# Patient Record
Sex: Female | Born: 1985
Health system: Southern US, Community
[De-identification: ages and names within clinical notes are randomized; demographics above are authoritative.]

## PROBLEM LIST (undated history)

## (undated) DIAGNOSIS — K429 Umbilical hernia without obstruction or gangrene: Secondary | ICD-10-CM

## (undated) DIAGNOSIS — G4730 Sleep apnea, unspecified: Secondary | ICD-10-CM

## (undated) DIAGNOSIS — A64 Unspecified sexually transmitted disease: Secondary | ICD-10-CM

## (undated) DIAGNOSIS — E669 Obesity, unspecified: Secondary | ICD-10-CM

## (undated) DIAGNOSIS — N809 Endometriosis, unspecified: Secondary | ICD-10-CM

## (undated) DIAGNOSIS — N83202 Unspecified ovarian cyst, left side: Secondary | ICD-10-CM

## (undated) DIAGNOSIS — D649 Anemia, unspecified: Secondary | ICD-10-CM

## (undated) DIAGNOSIS — N83201 Unspecified ovarian cyst, right side: Secondary | ICD-10-CM

## (undated) HISTORY — PX: ADENOIDECTOMY: SHX5191

## (undated) HISTORY — PX: CYSTECTOMY: SUR359

## (undated) HISTORY — DX: Unspecified sexually transmitted disease: A64

## (undated) HISTORY — PX: TONSILLECTOMY: SUR1361

## (undated) HISTORY — PX: OTHER SURGICAL HISTORY: SHX169

## (undated) HISTORY — PX: TOOTH EXTRACTION: SUR596

---

## 2004-02-02 ENCOUNTER — Inpatient Hospital Stay (HOSPITAL_COMMUNITY): Admission: AD | Admit: 2004-02-02 | Discharge: 2004-02-02 | Payer: Self-pay | Admitting: Obstetrics and Gynecology

## 2004-02-02 ENCOUNTER — Emergency Department (HOSPITAL_COMMUNITY): Admission: EM | Admit: 2004-02-02 | Discharge: 2004-02-02 | Payer: Self-pay | Admitting: Emergency Medicine

## 2004-02-05 ENCOUNTER — Inpatient Hospital Stay (HOSPITAL_COMMUNITY): Admission: AD | Admit: 2004-02-05 | Discharge: 2004-02-05 | Payer: Self-pay | Admitting: *Deleted

## 2004-02-07 ENCOUNTER — Inpatient Hospital Stay (HOSPITAL_COMMUNITY): Admission: AD | Admit: 2004-02-07 | Discharge: 2004-02-09 | Payer: Self-pay | Admitting: Obstetrics & Gynecology

## 2005-08-01 ENCOUNTER — Inpatient Hospital Stay (HOSPITAL_COMMUNITY): Admission: AD | Admit: 2005-08-01 | Discharge: 2005-08-01 | Payer: Self-pay | Admitting: *Deleted

## 2005-10-26 HISTORY — PX: DILATION AND CURETTAGE OF UTERUS: SHX78

## 2005-12-21 HISTORY — PX: OTHER SURGICAL HISTORY: SHX169

## 2010-01-13 ENCOUNTER — Emergency Department (HOSPITAL_BASED_OUTPATIENT_CLINIC_OR_DEPARTMENT_OTHER): Admission: EM | Admit: 2010-01-13 | Discharge: 2010-01-13 | Payer: Self-pay | Admitting: Emergency Medicine

## 2010-01-13 ENCOUNTER — Ambulatory Visit: Payer: Self-pay | Admitting: Interventional Radiology

## 2010-05-21 ENCOUNTER — Emergency Department (HOSPITAL_BASED_OUTPATIENT_CLINIC_OR_DEPARTMENT_OTHER): Admission: EM | Admit: 2010-05-21 | Discharge: 2010-05-21 | Payer: Self-pay | Admitting: Emergency Medicine

## 2010-05-21 ENCOUNTER — Ambulatory Visit: Payer: Self-pay | Admitting: Diagnostic Radiology

## 2010-06-24 ENCOUNTER — Ambulatory Visit (HOSPITAL_COMMUNITY): Admission: RE | Admit: 2010-06-24 | Discharge: 2010-06-24 | Payer: Self-pay | Admitting: Surgery

## 2010-10-18 ENCOUNTER — Emergency Department (HOSPITAL_BASED_OUTPATIENT_CLINIC_OR_DEPARTMENT_OTHER)
Admission: EM | Admit: 2010-10-18 | Discharge: 2010-10-18 | Payer: Self-pay | Source: Home / Self Care | Admitting: Emergency Medicine

## 2010-12-15 ENCOUNTER — Encounter (HOSPITAL_COMMUNITY): Payer: Self-pay | Admitting: Radiology

## 2010-12-15 ENCOUNTER — Inpatient Hospital Stay (HOSPITAL_COMMUNITY): Payer: Self-pay

## 2010-12-15 ENCOUNTER — Inpatient Hospital Stay (HOSPITAL_COMMUNITY)
Admission: AD | Admit: 2010-12-15 | Discharge: 2010-12-15 | Disposition: A | Discharge status: Home / Self Care | Payer: Self-pay | Source: Ambulatory Visit | Attending: Obstetrics & Gynecology | Admitting: Obstetrics & Gynecology

## 2010-12-15 DIAGNOSIS — R109 Unspecified abdominal pain: Secondary | ICD-10-CM | POA: Insufficient documentation

## 2010-12-15 DIAGNOSIS — R52 Pain, unspecified: Secondary | ICD-10-CM

## 2010-12-15 DIAGNOSIS — O3680X0 Pregnancy with inconclusive fetal viability, not applicable or unspecified: Secondary | ICD-10-CM

## 2010-12-15 DIAGNOSIS — O98819 Other maternal infectious and parasitic diseases complicating pregnancy, unspecified trimester: Secondary | ICD-10-CM | POA: Insufficient documentation

## 2010-12-15 DIAGNOSIS — A5901 Trichomonal vulvovaginitis: Secondary | ICD-10-CM | POA: Insufficient documentation

## 2010-12-15 LAB — URINALYSIS, ROUTINE W REFLEX MICROSCOPIC
Nitrite: NEGATIVE
Specific Gravity, Urine: 1.025 (ref 1.005–1.030)

## 2010-12-15 LAB — HCG, QUANTITATIVE, PREGNANCY: hCG, Beta Chain, Quant, S: 1460 m[IU]/mL — ABNORMAL HIGH (ref <5)

## 2010-12-15 LAB — URINE MICROSCOPIC-ADD ON

## 2010-12-15 LAB — CBC
HCT: 34.4 % — ABNORMAL LOW (ref 36.0–46.0)
MCH: 25.8 pg — ABNORMAL LOW (ref 26.0–34.0)
MCV: 73.3 fL — ABNORMAL LOW (ref 78.0–100.0)
Platelets: 278 10*3/uL (ref 150–400)

## 2010-12-15 LAB — WET PREP, GENITAL

## 2010-12-15 LAB — ABO/RH: ABO/RH(D): O POS

## 2010-12-16 LAB — URINE CULTURE

## 2010-12-16 LAB — GC/CHLAMYDIA PROBE AMP, GENITAL
Chlamydia, DNA Probe: NEGATIVE
GC Probe Amp, Genital: NEGATIVE

## 2010-12-19 ENCOUNTER — Other Ambulatory Visit: Payer: Self-pay | Admitting: Obstetrics & Gynecology

## 2010-12-19 ENCOUNTER — Other Ambulatory Visit (HOSPITAL_COMMUNITY): Payer: Self-pay | Admitting: Maternal and Fetal Medicine

## 2010-12-19 ENCOUNTER — Inpatient Hospital Stay (HOSPITAL_COMMUNITY)
Admission: AD | Admit: 2010-12-19 | Discharge: 2010-12-19 | Disposition: A | Discharge status: Home / Self Care | Payer: Self-pay | Source: Ambulatory Visit | Attending: Obstetrics & Gynecology | Admitting: Obstetrics & Gynecology

## 2010-12-19 DIAGNOSIS — O99891 Other specified diseases and conditions complicating pregnancy: Secondary | ICD-10-CM | POA: Insufficient documentation

## 2010-12-19 DIAGNOSIS — O3680X0 Pregnancy with inconclusive fetal viability, not applicable or unspecified: Secondary | ICD-10-CM

## 2010-12-19 DIAGNOSIS — R109 Unspecified abdominal pain: Secondary | ICD-10-CM | POA: Insufficient documentation

## 2010-12-25 ENCOUNTER — Other Ambulatory Visit (HOSPITAL_COMMUNITY): Payer: Self-pay

## 2010-12-25 ENCOUNTER — Inpatient Hospital Stay (HOSPITAL_COMMUNITY)
Admission: AD | Admit: 2010-12-25 | Discharge: 2010-12-25 | Disposition: A | Discharge status: Home / Self Care | Payer: Self-pay | Source: Ambulatory Visit | Attending: Family Medicine | Admitting: Family Medicine

## 2010-12-25 ENCOUNTER — Ambulatory Visit (HOSPITAL_COMMUNITY)
Admission: RE | Admit: 2010-12-25 | Discharge: 2010-12-25 | Disposition: A | Discharge status: Home / Self Care | Payer: Medicaid Other | Source: Ambulatory Visit | Attending: Obstetrics & Gynecology | Admitting: Obstetrics & Gynecology

## 2010-12-25 DIAGNOSIS — R109 Unspecified abdominal pain: Secondary | ICD-10-CM

## 2010-12-25 DIAGNOSIS — Z3689 Encounter for other specified antenatal screening: Secondary | ICD-10-CM | POA: Insufficient documentation

## 2010-12-25 DIAGNOSIS — O99891 Other specified diseases and conditions complicating pregnancy: Secondary | ICD-10-CM | POA: Insufficient documentation

## 2010-12-25 DIAGNOSIS — O3680X0 Pregnancy with inconclusive fetal viability, not applicable or unspecified: Secondary | ICD-10-CM

## 2010-12-25 DIAGNOSIS — O9989 Other specified diseases and conditions complicating pregnancy, childbirth and the puerperium: Secondary | ICD-10-CM

## 2011-01-09 LAB — COMPREHENSIVE METABOLIC PANEL
AST: 17 U/L (ref 0–37)
Albumin: 3.6 g/dL (ref 3.5–5.2)
Alkaline Phosphatase: 102 U/L (ref 39–117)
Creatinine, Ser: 0.67 mg/dL (ref 0.4–1.2)
GFR calc Af Amer: >60 mL/min (ref >60)
Sodium: 138 mEq/L (ref 135–145)
Total Protein: 6.9 g/dL (ref 6.0–8.3)

## 2011-01-09 LAB — DIFFERENTIAL
Basophils Relative: 0 % (ref 0–1)
Eosinophils Absolute: 0.1 10*3/uL (ref 0.0–0.7)
Eosinophils Relative: 2 % (ref 0–5)
Lymphocytes Relative: 42 % (ref 12–46)
Monocytes Relative: 9 % (ref 3–12)
Neutrophils Relative %: 46 % (ref 43–77)

## 2011-01-09 LAB — CBC
HCT: 33.7 % — ABNORMAL LOW (ref 36.0–46.0)
MCH: 26.1 pg (ref 26.0–34.0)
MCHC: 35.9 g/dL (ref 30.0–36.0)
RBC: 4.64 MIL/uL (ref 3.87–5.11)
RDW: 16.8 % — ABNORMAL HIGH (ref 11.5–15.5)

## 2011-02-17 ENCOUNTER — Inpatient Hospital Stay (HOSPITAL_COMMUNITY)
Admission: AD | Admit: 2011-02-17 | Discharge: 2011-02-17 | Disposition: A | Discharge status: Home / Self Care | Payer: Medicaid Other | Source: Ambulatory Visit | Attending: Obstetrics & Gynecology | Admitting: Obstetrics & Gynecology

## 2011-02-17 DIAGNOSIS — O9989 Other specified diseases and conditions complicating pregnancy, childbirth and the puerperium: Secondary | ICD-10-CM

## 2011-02-17 DIAGNOSIS — K429 Umbilical hernia without obstruction or gangrene: Secondary | ICD-10-CM | POA: Insufficient documentation

## 2011-02-17 DIAGNOSIS — O99891 Other specified diseases and conditions complicating pregnancy: Secondary | ICD-10-CM | POA: Insufficient documentation

## 2011-02-17 LAB — HIV ANTIBODY (ROUTINE TESTING W REFLEX): HIV: NONREACTIVE

## 2011-02-17 LAB — ANTIBODY SCREEN: Antibody Screen: POSITIVE

## 2011-02-17 LAB — ABO/RH: RH Type: POSITIVE

## 2011-02-17 LAB — RUBELLA ANTIBODY, IGM: Rubella: IMMUNE

## 2011-02-18 ENCOUNTER — Other Ambulatory Visit: Payer: Self-pay | Admitting: Surgery

## 2011-02-18 ENCOUNTER — Ambulatory Visit (HOSPITAL_COMMUNITY)
Admission: RE | Admit: 2011-02-18 | Discharge: 2011-02-18 | Disposition: A | Discharge status: Home / Self Care | Payer: Medicaid Other | Source: Ambulatory Visit | Attending: Surgery | Admitting: Surgery

## 2011-02-18 DIAGNOSIS — O99891 Other specified diseases and conditions complicating pregnancy: Secondary | ICD-10-CM | POA: Insufficient documentation

## 2011-02-18 DIAGNOSIS — L980 Pyogenic granuloma: Secondary | ICD-10-CM | POA: Insufficient documentation

## 2011-02-18 DIAGNOSIS — K429 Umbilical hernia without obstruction or gangrene: Secondary | ICD-10-CM | POA: Insufficient documentation

## 2011-02-18 DIAGNOSIS — Z01812 Encounter for preprocedural laboratory examination: Secondary | ICD-10-CM | POA: Insufficient documentation

## 2011-02-18 HISTORY — PX: EXCISION OF ENDOMETRIOMA: SHX6473

## 2011-02-18 LAB — COMPREHENSIVE METABOLIC PANEL
AST: 15 U/L (ref 0–37)
Alkaline Phosphatase: 72 U/L (ref 39–117)
Calcium: 8.9 mg/dL (ref 8.4–10.5)
Creatinine, Ser: 0.6 mg/dL (ref 0.4–1.2)
GFR calc non Af Amer: >60 mL/min (ref >60)
Potassium: 4 mEq/L (ref 3.5–5.1)
Sodium: 136 mEq/L (ref 135–145)
Total Bilirubin: 0.5 mg/dL (ref 0.3–1.2)
Total Protein: 7.5 g/dL (ref 6.0–8.3)

## 2011-02-18 LAB — DIFFERENTIAL
Basophils Absolute: 0 10*3/uL (ref 0.0–0.1)
Basophils Relative: 0 % (ref 0–1)
Eosinophils Absolute: 0 10*3/uL (ref 0.0–0.7)
Eosinophils Relative: 0 % (ref 0–5)
Lymphocytes Relative: 40 % (ref 12–46)
Lymphs Abs: 1.9 10*3/uL (ref 0.7–4.0)
Monocytes Absolute: 0.4 10*3/uL (ref 0.1–1.0)
Monocytes Relative: 8 % (ref 3–12)
Neutro Abs: 2.5 10*3/uL (ref 1.7–7.7)
Neutrophils Relative %: 52 % (ref 43–77)

## 2011-02-18 LAB — SURGICAL PCR SCREEN
MRSA, PCR: NEGATIVE
Staphylococcus aureus: NEGATIVE

## 2011-02-18 LAB — CBC
HCT: 33.3 % — ABNORMAL LOW (ref 36.0–46.0)
Hemoglobin: 12 g/dL (ref 12.0–15.0)
MCH: 27.2 pg (ref 26.0–34.0)
MCHC: 36 g/dL (ref 30.0–36.0)
MCV: 75.5 fL — ABNORMAL LOW (ref 78.0–100.0)
RBC: 4.41 MIL/uL (ref 3.87–5.11)
WBC: 4.7 10*3/uL (ref 4.0–10.5)

## 2011-02-23 NOTE — H&P (Signed)
Kathleen Watkins, Kathleen Watkins             ACCOUNT NO.:  [id number]  MEDICAL RECORD NO.:  [id number]           PATIENT TYPE:  O  LOCATION:  [facility]                         FACILITY:  [facility]  PHYSICIAN:  Kyree Fedorko A. Ulyess Muto, M.D.DATE OF BIRTH:  Oct 22, 1986  DATE OF ADMISSION:  02/17/2011 DATE OF DISCHARGE:  02/17/2011                             HISTORY & PHYSICAL   CHIEF COMPLAINT:  Drainage from umbilical hernia.  HISTORY OF PRESENT ILLNESS:  The patient is 25 year old female who was seen back in August 2011 by Dr. Claud Kelp due to a periumbilical hernia after being seen in the emergency room secondary to periumbilical drainage.  He examined her, he saw no evidence of open wound or drainage from the umbilicus at that time.  She has a 4.3 x 4.3 cm periumbilical hernia and was scheduled for surgery but she held off due to other issues.  She is now 14 weeks' pregnant.  She [facility] and open red areas in the umbilicus.  No nausea, no vomiting.  No fever, no chills.  She has had brown-to-tan drainage from the area.  PAST SURGICAL HISTORY:  C-section and ganglion cyst.  CURRENT MEDICATIONS:  Prenatal vitamins.  ALLERGIES TO MEDICINES:  None.  FAMILY HISTORY:  None.  REVIEW OF SYSTEMS:  As above, otherwise, negative.  PHYSICAL EXAMINATION:  VITAL SIGNS:  Temperature 97, pulse 80, blood pressure 130/78. GENERAL APPEARANCE:  Pleasant female, in no apparent distress. HEENT:  No jaundice.  Oropharynx, moist mucous membranes. NECK:  Supple, nontender.  Trachea midline without mass. CARDIOVASCULAR:  Normal sinus rhythm without rub, murmur, or gallop. ABDOMEN:  Morbidly obese.  There are 2 small open areas in the umbilicus that are red and draining some tan pink fluid.  Difficult to feel hernia.  Morbidly obese, large pannus.  This tracks downward it looks like.  No redness.  Succus still draining from the open wound to  the umbilicus. EXTREMITIES:  normal. NEUROLOGIC: normal_.  IMPRESSION:  History of 4.3 x 4.3 cm periumbilical hernia, now with drainage from the umbilicus which is worse than the past.  She is now 14 weeks' pregnant.  I feel that this needs to be explored in the operating room given her past history.  I spoke with Dr. Abigail Miyamoto who is on-call Chad and has agreed to see her tomorrow and try to repair this periumbilical hernia and explore umbilicus and probably debride this that is where it is going to be giving smell.  I do not see signs of any invasive infection or redness but with this open wound being present as well as hernia, there is concern this could be erosion of the skin over the hernia sac and this could be an issue.  I have discussed this with the patient as well as risks of bleeding and infection and the potential fetal demise and loss in this setting but she is in 2nd trimester and therefore I think this is the best time to proceed and I am not sure waiting until her delivery would be wise in this setting, this could certainly become more of an  issue for her.  She understands the above.     Kathleen Watkins, M.D.     TAC/MEDQ  D:  02/17/2011  T:  02/18/2011  Job:  [id number]  Electronically Signed by Harriette Bouillon M.D. on 02/23/2011 07:33:08 AM

## 2011-02-25 NOTE — Op Note (Signed)
  [name], Kathleen Watkins             ACCOUNT NO.:  [id number]  MEDICAL RECORD NO.:  [id number]           PATIENT TYPE:  O  LOCATION:  [facility]                         FACILITY:  [facility]  PHYSICIAN:  Abigail Miyamoto, M.D. DATE OF BIRTH:  04/14/1986  DATE OF PROCEDURE: DATE OF DISCHARGE:                              OPERATIVE REPORT   PREOPERATIVE DIAGNOSIS:  Draining umbilical hernia.  POSTOPERATIVE DIAGNOSIS:  Draining umbilical hernia.  PROCEDURE:  Umbilical hernia repair with excision of chronic granulation tissue.  SURGEON:  Abigail Miyamoto, M.D.  ANESTHESIA:  General with 0.5% Marcaine.  ESTIMATED BLOOD LOSS:  Minimal.  INDICATION FOR PROCEDURE:  Kathleen Watkins is a 25 year old female who is 14 weeks' pregnant.  She presents with an umbilical hernia.  She has large amount of granulation tissue on her umbilicus which is draining fluid.  Decision was made to proceed with repair of this.  PROCEDURE IN DETAIL:  The patient was brought to the operating room and identified as Kathleen Watkins.  She is placed supine on the operating table and general anesthesia was induced.  Her abdomen was then prepped and draped in usual sterile fashion.  I was able to grasp the chronic draining skin with an Allis clamp and was able to involute her umbilicus, raising all the umbilical skin up.  I then performed elliptical incision with a #15 blade, removed all the chronic granulation tissue.  There was a lot of cystic appearing structures and fluids underneath the umbilical skin which I removed en block with a draining tissue.  I saw no gross purulence.  This was sitting right on the hernia sac, which was excised as well.  I sent the chronic tissue to pathology for evaluation.  Omentum was found to be in the hernia which was easily reduced back to abdominal cavity.  I then closed the patient's fascial defect with several figure-of-eight #1 Novafil sutures.  I elected not to use mesh because of  the chronic draining tissue and for fear of infection.  The fascial defect was easily closed with interrupted nylon sutures.  I then anesthetized the fascia and skin with 0.5% Marcaine.  I then tacked the umbilical skin back in place with 3-0 Vicryl suture.  I then closed the skin with interrupted 3-0 nylon sutures.  A piece of Xeroform was placed on the incision, gauze tape was then placed there as well.  The patient tolerated the procedure well.  All counts were correct at the end of the procedure. The patient was then extubated in the operating room and taken in stable condition to recovery room.     Abigail Miyamoto, M.D.     DB/MEDQ  D:  02/18/2011  T:  02/18/2011  Job:  [id number]  Electronically Signed by Abigail Miyamoto M.D. on 02/25/2011 01:55:53 PM

## 2011-03-04 ENCOUNTER — Other Ambulatory Visit: Payer: Self-pay | Admitting: Obstetrics and Gynecology

## 2011-03-04 ENCOUNTER — Other Ambulatory Visit: Payer: Self-pay | Admitting: Obstetrics & Gynecology

## 2011-03-04 DIAGNOSIS — O99210 Obesity complicating pregnancy, unspecified trimester: Secondary | ICD-10-CM

## 2011-03-04 DIAGNOSIS — Z3689 Encounter for other specified antenatal screening: Secondary | ICD-10-CM

## 2011-03-04 DIAGNOSIS — Z331 Pregnant state, incidental: Secondary | ICD-10-CM

## 2011-03-04 DIAGNOSIS — Z0489 Encounter for examination and observation for other specified reasons: Secondary | ICD-10-CM

## 2011-03-04 DIAGNOSIS — E669 Obesity, unspecified: Secondary | ICD-10-CM

## 2011-03-04 DIAGNOSIS — O2690 Pregnancy related conditions, unspecified, unspecified trimester: Secondary | ICD-10-CM

## 2011-03-04 LAB — POCT URINALYSIS DIP (DEVICE)
Ketones, ur: NEGATIVE mg/dL
Protein, ur: 30 mg/dL — AB
Specific Gravity, Urine: 1.025 (ref 1.005–1.030)

## 2011-03-13 NOTE — Discharge Summary (Signed)
NAME:  Kathleen Watkins, Kathleen Watkins                       ACCOUNT NO.:  [id number]   MEDICAL RECORD NO.:  [id number]                   PATIENT TYPE:  INP   LOCATION:  9306                                 FACILITY:  [facility]   PHYSICIAN:  Phil D. Okey Dupre, M.D.                  DATE OF BIRTH:  [birth year]   DATE OF ADMISSION:  02/07/2004  DATE OF DISCHARGE:                                 DISCHARGE SUMMARY   LABORATORY DATA:  1. Hepatitis panel pending. Urine pregnancy test negative.  2. White blood cell count 5.3, hemoglobin 13.1, hematocrit 39.5, platelet     count 327, neutrophils 38.  3. HIV negative.  4. HSV culture pending.  5. RPR negative.   STUDIES:  None.   HOSPITAL COURSE:  This is a 25 year old G0 P0 who presented on February 07, 2004 to the [facility] with vaginal itching and pain which was 10/10 and so bad  that she was unable to urinate at all.  She was diagnosed with a primary  herpes infection and admitted upstairs for pain control and Foley catheter  placed.  She was given narcotics for pain and started on acyclovir IV for  the primary infection.  Over the next couple of days her pain lessened and  she was able to urinate on her own on day of discharge.  She was also eating  and drinking well and wanted to go home.   MEDICATIONS AT DISCHARGE:  1. Valtrex 500 mg p.o. b.i.d. x5 days.  2. Percocet 5/325 p.o. q.4h. p.r.n. pain.   FOLLOW-UP VISITS:  The patient is to follow up with Dr. __________ in a  month for follow-up and also she is to follow up with him in six months for  an HIV test.     Miles Costain, M.D.                          Phil D. Okey Dupre, M.D.    DY/MEDQ  D:  02/09/2004  T:  02/09/2004  Job:  [id number]

## 2011-03-18 ENCOUNTER — Ambulatory Visit (HOSPITAL_COMMUNITY): Payer: Medicaid Other

## 2011-03-18 ENCOUNTER — Other Ambulatory Visit: Payer: Self-pay | Admitting: Obstetrics and Gynecology

## 2011-03-18 DIAGNOSIS — E669 Obesity, unspecified: Secondary | ICD-10-CM

## 2011-03-18 DIAGNOSIS — O99210 Obesity complicating pregnancy, unspecified trimester: Secondary | ICD-10-CM

## 2011-03-18 DIAGNOSIS — Z331 Pregnant state, incidental: Secondary | ICD-10-CM

## 2011-03-18 DIAGNOSIS — O99810 Abnormal glucose complicating pregnancy: Secondary | ICD-10-CM

## 2011-03-18 LAB — POCT URINALYSIS DIP (DEVICE)
Glucose, UA: NEGATIVE mg/dL
Hgb urine dipstick: NEGATIVE
Specific Gravity, Urine: 1.02 (ref 1.005–1.030)
Urobilinogen, UA: 1 mg/dL (ref 0.0–1.0)
pH: 6 (ref 5.0–8.0)

## 2011-03-19 ENCOUNTER — Other Ambulatory Visit: Payer: Self-pay | Admitting: Obstetrics and Gynecology

## 2011-03-19 ENCOUNTER — Ambulatory Visit (HOSPITAL_COMMUNITY)
Admission: RE | Admit: 2011-03-19 | Discharge: 2011-03-19 | Disposition: A | Discharge status: Home / Self Care | Payer: Medicaid Other | Source: Ambulatory Visit | Attending: Obstetrics and Gynecology | Admitting: Obstetrics and Gynecology

## 2011-03-19 DIAGNOSIS — O2690 Pregnancy related conditions, unspecified, unspecified trimester: Secondary | ICD-10-CM

## 2011-03-19 DIAGNOSIS — Z0489 Encounter for examination and observation for other specified reasons: Secondary | ICD-10-CM

## 2011-03-19 DIAGNOSIS — Z3689 Encounter for other specified antenatal screening: Secondary | ICD-10-CM | POA: Insufficient documentation

## 2011-03-19 DIAGNOSIS — IMO0002 Reserved for concepts with insufficient information to code with codable children: Secondary | ICD-10-CM

## 2011-03-19 DIAGNOSIS — O09299 Supervision of pregnancy with other poor reproductive or obstetric history, unspecified trimester: Secondary | ICD-10-CM

## 2011-03-24 ENCOUNTER — Encounter: Payer: Medicaid Other | Admitting: Internal Medicine

## 2011-03-24 ENCOUNTER — Other Ambulatory Visit: Payer: Self-pay | Admitting: Internal Medicine

## 2011-03-24 ENCOUNTER — Encounter (HOSPITAL_BASED_OUTPATIENT_CLINIC_OR_DEPARTMENT_OTHER): Payer: Medicaid Other | Admitting: Internal Medicine

## 2011-03-24 DIAGNOSIS — D582 Other hemoglobinopathies: Secondary | ICD-10-CM

## 2011-03-24 LAB — CBC & DIFF AND RETIC
Basophils Absolute: 0 10*3/uL (ref 0.0–0.1)
Eosinophils Absolute: 0 10*3/uL (ref 0.0–0.5)
HGB: 11.5 g/dL — ABNORMAL LOW (ref 11.6–15.9)
LYMPH%: 30.4 % (ref 14.0–49.7)
MCV: 76 fL — ABNORMAL LOW (ref 79.5–101.0)
MONO%: 6.5 % (ref 0.0–14.0)
NEUT#: 3.4 10*3/uL (ref 1.5–6.5)
NEUT%: 62.5 % (ref 38.4–76.8)
Platelets: 235 10*3/uL (ref 145–400)

## 2011-03-31 LAB — HEMOGLOBINOPATHY EVALUATION
Hemoglobin Other: 34 % — ABNORMAL HIGH (ref 0.0–0.0)
Hgb A: 62.6 % — ABNORMAL LOW (ref 96.8–97.8)

## 2011-03-31 LAB — IRON AND TIBC
%SAT: 15 % — ABNORMAL LOW (ref 20–55)
Iron: 49 ug/dL (ref 42–145)

## 2011-03-31 LAB — COMPREHENSIVE METABOLIC PANEL
Alkaline Phosphatase: 92 U/L (ref 39–117)
Glucose, Bld: 97 mg/dL (ref 70–99)
Sodium: 132 mEq/L — ABNORMAL LOW (ref 135–145)
Total Bilirubin: 0.3 mg/dL (ref 0.3–1.2)
Total Protein: 7 g/dL (ref 6.0–8.3)

## 2011-03-31 LAB — HGB ELECTROPHORESIS REFLEXED REPORT
Hemoglobin A - HGBRFX: 58.9 % — ABNORMAL LOW (ref >96.0)
Hemoglobin A2 - HGBRFX: 3 % (ref 1.8–3.5)
Hemoglobin Elect C: 38.1 % — ABNORMAL HIGH

## 2011-03-31 LAB — FERRITIN: Ferritin: 28 ng/mL (ref 10–291)

## 2011-04-01 ENCOUNTER — Inpatient Hospital Stay (HOSPITAL_COMMUNITY)
Admission: AD | Admit: 2011-04-01 | Discharge: 2011-04-01 | Disposition: A | Discharge status: Home / Self Care | Payer: Medicaid Other | Source: Ambulatory Visit | Attending: Obstetrics and Gynecology | Admitting: Obstetrics and Gynecology

## 2011-04-01 ENCOUNTER — Inpatient Hospital Stay (HOSPITAL_COMMUNITY): Payer: Medicaid Other

## 2011-04-01 DIAGNOSIS — O200 Threatened abortion: Secondary | ICD-10-CM

## 2011-04-01 LAB — URINALYSIS, ROUTINE W REFLEX MICROSCOPIC
Bilirubin Urine: NEGATIVE
Glucose, UA: NEGATIVE mg/dL
Hgb urine dipstick: NEGATIVE
Ketones, ur: NEGATIVE mg/dL
pH: 8 (ref 5.0–8.0)

## 2011-04-01 LAB — URINE MICROSCOPIC-ADD ON

## 2011-04-01 LAB — WET PREP, GENITAL: Trich, Wet Prep: NONE SEEN

## 2011-04-02 ENCOUNTER — Other Ambulatory Visit: Payer: Self-pay | Admitting: Obstetrics and Gynecology

## 2011-04-02 ENCOUNTER — Ambulatory Visit (HOSPITAL_COMMUNITY)
Admission: RE | Admit: 2011-04-02 | Discharge: 2011-04-02 | Disposition: A | Discharge status: Home / Self Care | Payer: Medicaid Other | Source: Ambulatory Visit | Attending: Obstetrics and Gynecology | Admitting: Obstetrics and Gynecology

## 2011-04-02 ENCOUNTER — Ambulatory Visit (HOSPITAL_COMMUNITY): Payer: Medicaid Other

## 2011-04-02 DIAGNOSIS — Z0489 Encounter for examination and observation for other specified reasons: Secondary | ICD-10-CM

## 2011-04-02 DIAGNOSIS — O34219 Maternal care for unspecified type scar from previous cesarean delivery: Secondary | ICD-10-CM | POA: Insufficient documentation

## 2011-04-02 DIAGNOSIS — E669 Obesity, unspecified: Secondary | ICD-10-CM | POA: Insufficient documentation

## 2011-04-02 DIAGNOSIS — O09299 Supervision of pregnancy with other poor reproductive or obstetric history, unspecified trimester: Secondary | ICD-10-CM

## 2011-04-02 DIAGNOSIS — O2690 Pregnancy related conditions, unspecified, unspecified trimester: Secondary | ICD-10-CM

## 2011-04-02 LAB — GC/CHLAMYDIA PROBE AMP, GENITAL: Chlamydia, DNA Probe: NEGATIVE

## 2011-04-15 ENCOUNTER — Other Ambulatory Visit: Payer: Self-pay | Admitting: Obstetrics and Gynecology

## 2011-04-15 DIAGNOSIS — O99210 Obesity complicating pregnancy, unspecified trimester: Secondary | ICD-10-CM

## 2011-04-15 DIAGNOSIS — E669 Obesity, unspecified: Secondary | ICD-10-CM

## 2011-04-15 DIAGNOSIS — O99810 Abnormal glucose complicating pregnancy: Secondary | ICD-10-CM

## 2011-04-15 LAB — POCT URINALYSIS DIP (DEVICE)
Bilirubin Urine: NEGATIVE
Glucose, UA: NEGATIVE mg/dL
Ketones, ur: NEGATIVE mg/dL
Specific Gravity, Urine: 1.025 (ref 1.005–1.030)
Urobilinogen, UA: 2 mg/dL — ABNORMAL HIGH (ref 0.0–1.0)

## 2011-04-16 ENCOUNTER — Ambulatory Visit (HOSPITAL_COMMUNITY): Payer: Medicaid Other

## 2011-04-16 ENCOUNTER — Ambulatory Visit (HOSPITAL_COMMUNITY)
Admission: RE | Admit: 2011-04-16 | Discharge: 2011-04-16 | Disposition: A | Discharge status: Home / Self Care | Payer: Medicaid Other | Source: Ambulatory Visit | Attending: Obstetrics and Gynecology | Admitting: Obstetrics and Gynecology

## 2011-04-16 ENCOUNTER — Other Ambulatory Visit: Payer: Self-pay | Admitting: Obstetrics and Gynecology

## 2011-04-16 DIAGNOSIS — E669 Obesity, unspecified: Secondary | ICD-10-CM | POA: Insufficient documentation

## 2011-04-16 DIAGNOSIS — O34219 Maternal care for unspecified type scar from previous cesarean delivery: Secondary | ICD-10-CM | POA: Insufficient documentation

## 2011-04-16 DIAGNOSIS — Z0489 Encounter for examination and observation for other specified reasons: Secondary | ICD-10-CM

## 2011-04-16 DIAGNOSIS — IMO0002 Reserved for concepts with insufficient information to code with codable children: Secondary | ICD-10-CM

## 2011-04-20 ENCOUNTER — Other Ambulatory Visit: Payer: Self-pay | Admitting: Obstetrics and Gynecology

## 2011-04-20 DIAGNOSIS — Z09 Encounter for follow-up examination after completed treatment for conditions other than malignant neoplasm: Secondary | ICD-10-CM

## 2011-04-30 ENCOUNTER — Ambulatory Visit (HOSPITAL_COMMUNITY)
Admission: RE | Admit: 2011-04-30 | Discharge: 2011-04-30 | Disposition: A | Discharge status: Home / Self Care | Payer: Medicaid Other | Source: Ambulatory Visit | Attending: Obstetrics and Gynecology | Admitting: Obstetrics and Gynecology

## 2011-04-30 ENCOUNTER — Other Ambulatory Visit: Payer: Self-pay | Admitting: Obstetrics and Gynecology

## 2011-04-30 DIAGNOSIS — Z09 Encounter for follow-up examination after completed treatment for conditions other than malignant neoplasm: Secondary | ICD-10-CM

## 2011-04-30 DIAGNOSIS — E669 Obesity, unspecified: Secondary | ICD-10-CM | POA: Insufficient documentation

## 2011-04-30 DIAGNOSIS — O2690 Pregnancy related conditions, unspecified, unspecified trimester: Secondary | ICD-10-CM

## 2011-04-30 DIAGNOSIS — O34219 Maternal care for unspecified type scar from previous cesarean delivery: Secondary | ICD-10-CM | POA: Insufficient documentation

## 2011-05-13 ENCOUNTER — Other Ambulatory Visit: Payer: Self-pay | Admitting: Obstetrics and Gynecology

## 2011-05-13 ENCOUNTER — Ambulatory Visit (HOSPITAL_COMMUNITY)
Admission: RE | Admit: 2011-05-13 | Discharge: 2011-05-13 | Disposition: A | Discharge status: Home / Self Care | Payer: Medicaid Other | Source: Ambulatory Visit | Attending: Obstetrics and Gynecology | Admitting: Obstetrics and Gynecology

## 2011-05-13 ENCOUNTER — Encounter (HOSPITAL_COMMUNITY): Payer: Self-pay

## 2011-05-13 DIAGNOSIS — O34219 Maternal care for unspecified type scar from previous cesarean delivery: Secondary | ICD-10-CM | POA: Insufficient documentation

## 2011-05-13 DIAGNOSIS — O99810 Abnormal glucose complicating pregnancy: Secondary | ICD-10-CM

## 2011-05-13 DIAGNOSIS — O99210 Obesity complicating pregnancy, unspecified trimester: Secondary | ICD-10-CM

## 2011-05-13 DIAGNOSIS — E669 Obesity, unspecified: Secondary | ICD-10-CM

## 2011-05-13 DIAGNOSIS — O09299 Supervision of pregnancy with other poor reproductive or obstetric history, unspecified trimester: Secondary | ICD-10-CM | POA: Insufficient documentation

## 2011-05-13 DIAGNOSIS — O2690 Pregnancy related conditions, unspecified, unspecified trimester: Secondary | ICD-10-CM

## 2011-05-13 LAB — POCT URINALYSIS DIP (DEVICE)
Glucose, UA: NEGATIVE mg/dL
Ketones, ur: NEGATIVE mg/dL
Nitrite: NEGATIVE

## 2011-05-15 LAB — CULTURE, OB URINE

## 2011-05-20 ENCOUNTER — Ambulatory Visit (HOSPITAL_COMMUNITY)
Admission: RE | Admit: 2011-05-20 | Discharge: 2011-05-20 | Disposition: A | Discharge status: Home / Self Care | Payer: Medicaid Other | Source: Ambulatory Visit | Attending: Obstetrics and Gynecology | Admitting: Obstetrics and Gynecology

## 2011-05-20 DIAGNOSIS — O34219 Maternal care for unspecified type scar from previous cesarean delivery: Secondary | ICD-10-CM | POA: Insufficient documentation

## 2011-05-20 DIAGNOSIS — E669 Obesity, unspecified: Secondary | ICD-10-CM | POA: Insufficient documentation

## 2011-05-20 DIAGNOSIS — O09299 Supervision of pregnancy with other poor reproductive or obstetric history, unspecified trimester: Secondary | ICD-10-CM | POA: Insufficient documentation

## 2011-05-20 NOTE — Progress Notes (Signed)
Report in AS/EPIC; follow-up 1 week for growth, MCA Doppler readings.  Increased MCA to 1.35 MOM today. Patient counseled about increased potential for fetal transfusion at [facility].

## 2011-05-27 ENCOUNTER — Ambulatory Visit (HOSPITAL_COMMUNITY)
Admission: RE | Admit: 2011-05-27 | Discharge: 2011-05-27 | Disposition: A | Discharge status: Home / Self Care | Payer: Medicaid Other | Source: Ambulatory Visit | Attending: Obstetrics and Gynecology | Admitting: Obstetrics and Gynecology

## 2011-05-27 ENCOUNTER — Other Ambulatory Visit: Payer: Self-pay | Admitting: Obstetrics and Gynecology

## 2011-05-27 ENCOUNTER — Other Ambulatory Visit (HOSPITAL_COMMUNITY): Payer: Self-pay | Admitting: Obstetrics and Gynecology

## 2011-05-27 VITALS — BP 134/66 | HR 93 | Wt 336.0 lb

## 2011-05-27 DIAGNOSIS — E669 Obesity, unspecified: Secondary | ICD-10-CM | POA: Insufficient documentation

## 2011-05-27 DIAGNOSIS — O34219 Maternal care for unspecified type scar from previous cesarean delivery: Secondary | ICD-10-CM | POA: Insufficient documentation

## 2011-05-27 NOTE — Progress Notes (Signed)
See ultrasound report in [facility]GYN 

## 2011-05-27 NOTE — Progress Notes (Signed)
Encounter addended by: Macarthur Critchley. Rachel Bo on: 05/27/2011  2:54 PM<BR>     Documentation filed: Orders

## 2011-06-04 ENCOUNTER — Ambulatory Visit: Payer: Medicaid Other | Admitting: Family Medicine

## 2011-06-04 ENCOUNTER — Ambulatory Visit (HOSPITAL_COMMUNITY)
Admission: RE | Admit: 2011-06-04 | Discharge: 2011-06-04 | Disposition: A | Discharge status: Home / Self Care | Payer: Medicaid Other | Source: Ambulatory Visit | Attending: Obstetrics and Gynecology | Admitting: Obstetrics and Gynecology

## 2011-06-04 ENCOUNTER — Other Ambulatory Visit: Payer: Self-pay | Admitting: Family Medicine

## 2011-06-04 DIAGNOSIS — O99810 Abnormal glucose complicating pregnancy: Secondary | ICD-10-CM

## 2011-06-04 DIAGNOSIS — O99210 Obesity complicating pregnancy, unspecified trimester: Secondary | ICD-10-CM

## 2011-06-04 DIAGNOSIS — O34219 Maternal care for unspecified type scar from previous cesarean delivery: Secondary | ICD-10-CM | POA: Insufficient documentation

## 2011-06-04 DIAGNOSIS — O0990 Supervision of high risk pregnancy, unspecified, unspecified trimester: Secondary | ICD-10-CM

## 2011-06-04 DIAGNOSIS — O361990 Maternal care for other isoimmunization, unspecified trimester, not applicable or unspecified: Secondary | ICD-10-CM

## 2011-06-04 DIAGNOSIS — E669 Obesity, unspecified: Secondary | ICD-10-CM | POA: Insufficient documentation

## 2011-06-04 LAB — CBC
HCT: 31.6 % — ABNORMAL LOW (ref 36.0–46.0)
MCH: 27.3 pg (ref 26.0–34.0)
MCV: 76.9 fL — ABNORMAL LOW (ref 78.0–100.0)
RBC: 4.11 MIL/uL (ref 3.87–5.11)
WBC: 6.7 10*3/uL (ref 4.0–10.5)

## 2011-06-04 LAB — POCT URINALYSIS DIP (DEVICE)
Glucose, UA: NEGATIVE mg/dL
Hgb urine dipstick: NEGATIVE
Ketones, ur: NEGATIVE mg/dL
Protein, ur: NEGATIVE mg/dL
Specific Gravity, Urine: 1.025 (ref 1.005–1.030)

## 2011-06-04 NOTE — Progress Notes (Signed)
Ultrasound in [facility]/OBGYN/EPIC.  Follow up U/S scheduled 24 hours

## 2011-06-05 ENCOUNTER — Ambulatory Visit (HOSPITAL_COMMUNITY)
Admission: RE | Admit: 2011-06-05 | Discharge: 2011-06-05 | Disposition: A | Discharge status: Home / Self Care | Payer: Medicaid Other | Source: Ambulatory Visit | Attending: Obstetrics and Gynecology | Admitting: Obstetrics and Gynecology

## 2011-06-05 ENCOUNTER — Other Ambulatory Visit: Payer: Self-pay | Admitting: Obstetrics

## 2011-06-05 ENCOUNTER — Other Ambulatory Visit: Payer: Self-pay | Admitting: Obstetrics and Gynecology

## 2011-06-05 DIAGNOSIS — O2690 Pregnancy related conditions, unspecified, unspecified trimester: Secondary | ICD-10-CM

## 2011-06-05 DIAGNOSIS — O34219 Maternal care for unspecified type scar from previous cesarean delivery: Secondary | ICD-10-CM

## 2011-06-05 DIAGNOSIS — E669 Obesity, unspecified: Secondary | ICD-10-CM | POA: Insufficient documentation

## 2011-06-05 DIAGNOSIS — O361990 Maternal care for other isoimmunization, unspecified trimester, not applicable or unspecified: Secondary | ICD-10-CM

## 2011-06-05 LAB — GLUCOSE TOLERANCE, 1 HOUR: Glucose, 1 Hour GTT: 98 mg/dL (ref 70–140)

## 2011-06-05 NOTE — Progress Notes (Signed)
Report in [facility]OB[facility]N/EPIC; abnormal MCA Doppler (1.53 MOM). Will be admitted to [facility] on 8/11 for ACS, reassessment of fetal MCA, possible PUBS/IUFT on 8/14

## 2011-06-08 ENCOUNTER — Other Ambulatory Visit (HOSPITAL_COMMUNITY): Payer: Self-pay | Admitting: *Deleted

## 2011-06-08 DIAGNOSIS — O368290 Fetal anemia and thrombocytopenia, unspecified trimester, not applicable or unspecified: Secondary | ICD-10-CM

## 2011-06-10 ENCOUNTER — Ambulatory Visit (HOSPITAL_COMMUNITY): Admission: RE | Admit: 2011-06-10 | Payer: Medicaid Other | Source: Ambulatory Visit

## 2011-06-11 ENCOUNTER — Other Ambulatory Visit: Payer: Self-pay | Admitting: Obstetrics and Gynecology

## 2011-06-11 ENCOUNTER — Ambulatory Visit (HOSPITAL_COMMUNITY)
Admission: RE | Admit: 2011-06-11 | Discharge: 2011-06-11 | Disposition: A | Discharge status: Home / Self Care | Payer: Medicaid Other | Source: Ambulatory Visit | Attending: Obstetrics and Gynecology | Admitting: Obstetrics and Gynecology

## 2011-06-11 VITALS — BP 113/61 | HR 110 | Wt 335.0 lb

## 2011-06-11 DIAGNOSIS — E669 Obesity, unspecified: Secondary | ICD-10-CM | POA: Insufficient documentation

## 2011-06-11 DIAGNOSIS — O34219 Maternal care for unspecified type scar from previous cesarean delivery: Secondary | ICD-10-CM | POA: Insufficient documentation

## 2011-06-11 DIAGNOSIS — O99210 Obesity complicating pregnancy, unspecified trimester: Secondary | ICD-10-CM | POA: Insufficient documentation

## 2011-06-11 DIAGNOSIS — O368290 Fetal anemia and thrombocytopenia, unspecified trimester, not applicable or unspecified: Secondary | ICD-10-CM

## 2011-06-11 DIAGNOSIS — O361190 Maternal care for Anti-A sensitization, unspecified trimester, not applicable or unspecified: Secondary | ICD-10-CM

## 2011-06-11 LAB — POCT URINALYSIS DIP (DEVICE)
Glucose, UA: NEGATIVE mg/dL
Ketones, ur: NEGATIVE mg/dL
Specific Gravity, Urine: 1.025 (ref 1.005–1.030)

## 2011-06-11 NOTE — Progress Notes (Signed)
Vital signs reviewed; see ultrasound report 

## 2011-06-17 ENCOUNTER — Ambulatory Visit (HOSPITAL_COMMUNITY)
Admission: RE | Admit: 2011-06-17 | Discharge: 2011-06-17 | Disposition: A | Discharge status: Home / Self Care | Payer: Medicaid Other | Source: Ambulatory Visit | Attending: Obstetrics and Gynecology | Admitting: Obstetrics and Gynecology

## 2011-06-17 ENCOUNTER — Other Ambulatory Visit: Payer: Self-pay | Admitting: Obstetrics and Gynecology

## 2011-06-17 ENCOUNTER — Other Ambulatory Visit (HOSPITAL_COMMUNITY): Payer: Self-pay | Admitting: Maternal and Fetal Medicine

## 2011-06-17 ENCOUNTER — Other Ambulatory Visit: Payer: Self-pay | Admitting: Maternal and Fetal Medicine

## 2011-06-17 DIAGNOSIS — E669 Obesity, unspecified: Secondary | ICD-10-CM | POA: Insufficient documentation

## 2011-06-17 DIAGNOSIS — O34219 Maternal care for unspecified type scar from previous cesarean delivery: Secondary | ICD-10-CM | POA: Insufficient documentation

## 2011-06-17 DIAGNOSIS — O368290 Fetal anemia and thrombocytopenia, unspecified trimester, not applicable or unspecified: Secondary | ICD-10-CM | POA: Insufficient documentation

## 2011-06-17 NOTE — Progress Notes (Signed)
Patient seen for ultrasound only appointment today.  Please see [facility]-OBGYN report for details.  

## 2011-06-19 ENCOUNTER — Telehealth: Payer: Self-pay | Admitting: *Deleted

## 2011-06-19 NOTE — Telephone Encounter (Signed)
Called pt. She states she has tried Tums, milk and mylanta without good results. The worst time is @ night when sleeping.  I advised pt to take Zantac OTC- 1 tab by mouth twice daily. She also may take mylanta 2 tbsp @ HS. She should call back next week if still having problems. Pt voiced understanding.

## 2011-06-24 ENCOUNTER — Ambulatory Visit (HOSPITAL_COMMUNITY): Payer: Medicaid Other

## 2011-06-24 DIAGNOSIS — Z8632 Personal history of gestational diabetes: Secondary | ICD-10-CM | POA: Insufficient documentation

## 2011-06-24 DIAGNOSIS — O99810 Abnormal glucose complicating pregnancy: Secondary | ICD-10-CM

## 2011-06-24 DIAGNOSIS — O361990 Maternal care for other isoimmunization, unspecified trimester, not applicable or unspecified: Secondary | ICD-10-CM

## 2011-06-26 ENCOUNTER — Ambulatory Visit (HOSPITAL_COMMUNITY)
Admission: RE | Admit: 2011-06-26 | Discharge: 2011-06-26 | Disposition: A | Discharge status: Home / Self Care | Payer: Medicaid Other | Source: Ambulatory Visit | Attending: Obstetrics and Gynecology | Admitting: Obstetrics and Gynecology

## 2011-06-26 VITALS — BP 111/66 | HR 97 | Wt 335.0 lb

## 2011-06-26 DIAGNOSIS — O09299 Supervision of pregnancy with other poor reproductive or obstetric history, unspecified trimester: Secondary | ICD-10-CM | POA: Insufficient documentation

## 2011-06-26 DIAGNOSIS — O361990 Maternal care for other isoimmunization, unspecified trimester, not applicable or unspecified: Secondary | ICD-10-CM

## 2011-06-26 DIAGNOSIS — O99210 Obesity complicating pregnancy, unspecified trimester: Secondary | ICD-10-CM | POA: Insufficient documentation

## 2011-06-26 DIAGNOSIS — E669 Obesity, unspecified: Secondary | ICD-10-CM | POA: Insufficient documentation

## 2011-06-26 DIAGNOSIS — O34219 Maternal care for unspecified type scar from previous cesarean delivery: Secondary | ICD-10-CM | POA: Insufficient documentation

## 2011-06-26 NOTE — Progress Notes (Addendum)
Report in [facility]OBGYN/EPIC; follow-up as needed. Fetus is Kell antigen negative.

## 2011-06-26 NOTE — Progress Notes (Signed)
Encounter addended by: Marlana Latus, RN on: 06/26/2011 12:12 PM<BR>     Documentation filed: Episodes, Chief Complaint Section

## 2011-07-02 ENCOUNTER — Other Ambulatory Visit: Payer: Self-pay | Admitting: Obstetrics and Gynecology

## 2011-07-02 ENCOUNTER — Telehealth (HOSPITAL_COMMUNITY): Payer: Self-pay | Admitting: *Deleted

## 2011-07-02 ENCOUNTER — Ambulatory Visit (INDEPENDENT_AMBULATORY_CARE_PROVIDER_SITE_OTHER): Payer: Medicaid Other | Admitting: Family Medicine

## 2011-07-02 DIAGNOSIS — O99210 Obesity complicating pregnancy, unspecified trimester: Secondary | ICD-10-CM

## 2011-07-02 DIAGNOSIS — O361190 Maternal care for Anti-A sensitization, unspecified trimester, not applicable or unspecified: Secondary | ICD-10-CM

## 2011-07-02 DIAGNOSIS — O99810 Abnormal glucose complicating pregnancy: Secondary | ICD-10-CM

## 2011-07-02 DIAGNOSIS — O361990 Maternal care for other isoimmunization, unspecified trimester, not applicable or unspecified: Secondary | ICD-10-CM

## 2011-07-02 LAB — POCT URINALYSIS DIP (DEVICE)
Glucose, UA: NEGATIVE mg/dL
Hgb urine dipstick: NEGATIVE
Protein, ur: NEGATIVE mg/dL
Specific Gravity, Urine: 1.02 (ref 1.005–1.030)
Urobilinogen, UA: 1 mg/dL (ref 0.0–1.0)
pH: 7 (ref 5.0–8.0)

## 2011-07-02 NOTE — Telephone Encounter (Signed)
Message copied by Nataley Bahri, [place] L on Thu Jul 02, 2011 12:32 PM ------      Message from: Reva Bores      Created: Thu Jul 02, 2011 10:53 AM       Book for rcs and btl, attending + fellow at 39 wks.

## 2011-07-02 NOTE — Progress Notes (Signed)
Pulse-90, Edema-feet (present), no vaginal discharge, c/o of headaches/dizziness x 1 week

## 2011-07-02 NOTE — Telephone Encounter (Signed)
Message copied by Terrika Zuver, [place] L on Thu Jul 02, 2011 12:38 PM ------      Message from: Reva Bores      Created: Thu Jul 02, 2011 10:53 AM       Book for rcs and btl, attending + fellow at 39 wks.

## 2011-07-02 NOTE — Patient Instructions (Addendum)
Cesarean Delivery Discussion Cesarean delivery is the birth of a fetus through a cut (incision) in the belly (abdomen) and womb (uterus). The cut is made by a surgeon. Cesarean delivery is also known as:  C-section.  Caesarean.   Cesarian.  Caesarian.   There are two kinds of Cesareans:  A vertical incision. This is usually done when the placenta has grown low in the womb (placenta previa).   The sideways (transverse) incision, which is more common.  The incision on the abdomen does not have to be the same as the incision on the uterus.  Reasons for having a cesarean delivery include:  You have had a c-section previously. This is especially true if your previous c-section was a vertical incision. Labor with a vertical incision has a high incidence of rupture of the uterus. There also are other reasons to do a repeat c-section.   Failure to progress in labor. The cervix does not open (dilate) or soften and become thinner (efface) with labor.   The baby is not coming head first, but buttocks first (breech presentation). Not all breech babies have to be delivered by c-section.   Concern for the baby or mother's well-being.   The baby's heart monitor may show the baby is not doing well   The mother may have high blood pressure (preeclampsia) or some other medical problem.   The baby is very large, over 8 1/2 pounds (3.85 kg) or more (macrosomia). In this condition, the baby cannot fit through the birth canal.   The baby is lying sideways in the uterus (transverse lie).   The placenta may be partially covering or completely covering the opening to the cervix (placenta previa).   The placenta partially or completely separates from your uterus (placenta abruption).   Herpes or HIV virus infection.   When attempting to turn a breech or or transverse lie of the baby to a vertex (cephalic version) if fetal distress develops.   Twins or more.   Eclampsia.   Diabetes or other medical  problems.   Previous surgery on the uterus (removal of a fibroid).   Trauma to the mother.   Post-term pregnancy (2 weeks or more).  WHAT HAPPENS DURING A C-SECTION: Most of the time the father can be in the room, but usually not during an emergency c-section. You will have an IV (intravenous) put in your arm. The nurse will remove any hair from your pubic area and your lower abdomen to prevent infection in the incision site. You will be given an antacid medication to drink. This will prevent acid contents from your stomach from going into your lungs if you vomit during the surgery. A Foley catheter will be placed in your bladder. You may be given an antibiotic to prevent infection. An anesthesiologist will either:  Put you to sleep using a general anesthesia.   Give you a regional anesthetic (spinal or epidural).  After you are given sleeping or numbing medicine, an incision will be made in your abdomen and then in your uterus to remove the baby. If you have a regional anesthetic, you will see the baby right away. If you are put to sleep, you will see the baby as soon as you are awake. You can still breastfeed the baby after a c-section. You will be given pain medication as needed. The intravenous and catheter are removed in 1 to 2 days. You will go home in 2 to 4 days if there are no problems. RISKS AND  COMPLICATIONS  Bleeding.   Infection.   Blood clots.   Injury to surrounding organs.   Anesthesia problems.   Injury to the baby.  DECISION ON WHETHER TO HAVE A C-SECTION Many factors determine whether a c-section is needed. Your caregiver will help you determine what plan is best for you. Some facts to consider are:  It may be safe to have a normal delivery even though the previous delivery was a c-section. This is called a VBAC (vaginal birth after cesarean).   If a repeat c-section is considered, it is necessary to make sure the baby's lungs are mature before the c-section. A  sample of the fluid surrounding the baby (amniotic fluid) is removed and studied to determine this.   If you decide to have a VBAC, it may still result in a repeat c-section if your caregiver thinks there are problems during labor. Your caregiver will discuss this with you.   It is safe to have an epidural for anesthesia if you elect to have a baby vaginally following a previous c-section.  Permanent sterilization (tubal ligation, getting your tubes tied) can be done during the operation. This does not extend your stay in the hospital. HOME CARE INSTRUCTIONS Healing will take time. You will have discomfort, tenderness, swelling and bruising at the operative site for a couple of weeks. This is normal and will get better as time goes on.   You may have some vaginal bleeding for 1 to 2 weeks. This is normal.   Only take over-the-counter or prescription medicines for pain, discomfort or fever as directed by your caregiver.   Do not take aspirin. It can cause bleeding.   Do not drive when taking pain medication.   Follow your caregiver's advice regarding diet, exercise, lifting, driving and general activities.   Resume your usual diet as directed and allowed.   Get plenty of rest and sleep.   Do not douche, use tampons, or have sexual intercourse until your caregiver gives you permission.   Change your bandages (dressings) as directed.   Take your temperature twice a day. Write it down.   Your caregiver may recommend showers instead of baths for a few weeks.   Do not drink alcohol until your caregiver gives you permission.   If you develop constipation, you may take a mild laxative with your caregiver's permission. Bran foods and drinking fluids helps with constipation problems.   Try to have someone home with you for a week or two to help with the household activities.   Make sure you and your family understands everything about your operation and recovery.   Do not sign any legal  documents until you feel normal again.   Keep all your postpartum appointments as recommended by your caregiver. After a cesarean, your post delivery appointments are usually 2 weeks and then 4 to 6 weeks.  SEEK MEDICAL CARE IF:  There is swelling, redness or increasing pain in the wound area.   Pus is coming from the wound.   You notice a bad smell from the wound or surgical dressing.   You have pain, redness and swelling from the intravenous site.   The wound is breaking open (the edges are not staying together).   You feel dizzy or feel like fainting.   You develop pain or bleeding when you urinate.   You develop diarrhea.   You develop nausea and vomiting.   You develop abnormal vaginal discharge.   You develop a rash.  You have any type of abnormal reaction or develop an allergy to your medication.   You need stronger pain medication for your pain.  SEEK IMMEDIATE MEDICAL CARE:  You develop a temperature of 100 F (37.8 C) or higher.   You develop abdominal pain.   You develop chest pain.   You develop shortness of breath.   You pass out.   You develop pain, swelling or redness of your leg.   You develop heavy vaginal bleeding with or without blood clots.  Document Released: 10/12/2005 Document Re-Released: 08/08/2009 [facility] Patient Information 2011 [place], [place]. Pregnancy - Third Trimester The third trimester of pregnancy (the last 3 months) is a period of the most rapid growth for you and your baby. The baby approaches a length of 20 inches and a weight of 6 to 10 pounds. The baby is adding on fat and getting ready for life outside your body. While inside, babies have periods of sleeping and waking, suck their thumbs, and hiccups. You can often feel small contractions of the uterus. This is false labor. It is also called Braxton-Hicks contractions. This is like a practice for labor. The usual problems in this stage of pregnancy include more difficulty  breathing, swelling of the hands and feet from water retention, and having to urinate more often because of the uterus and baby pressing on your bladder.  PRENATAL EXAMS  Blood work may continue to be done during prenatal exams. These tests are done to check on your health and the probable health of your baby. Blood work is used to follow your blood levels (hemoglobin). Anemia (low hemoglobin) is common during pregnancy. Iron and vitamins are given to help prevent this. You may also continue to be checked for diabetes. Some of the past blood tests may be done again.   The size of the uterus is measured during each visit. This makes sure your baby is growing properly according to your pregnancy dates.   Your blood pressure is checked every prenatal visit. This is to make sure you are not getting toxemia.   Your urine is checked every prenatal visit for infection, diabetes and protein.   Your weight is checked at each visit. This is done to make sure gains are happening at the suggested rate and that you and your baby are growing normally.   Sometimes, an ultrasound is performed to confirm the position and the proper growth and development of the baby. This is a test done that bounces harmless sound waves off the baby so your caregiver can more accurately determine due dates.   Discuss the type of pain medication and anesthesia you will have during your labor and delivery.   Discuss the possibility and anesthesia if a Cesarean Section might be necessary.   Inform your caregiver if there is any mental or physical violence at home.  Sometimes, a specialized non-stress test, contraction stress test and biophysical profile are done to make sure the baby is not having a problem. Checking the amniotic fluid surrounding the baby is called an amniocentesis. The amniotic fluid is removed by sticking a needle into the belly (abdomen). This is sometimes done near the end of pregnancy if an early delivery is  required. In this case, it is done to help make sure the baby's lungs are mature enough for the baby to live outside of the womb. If the lungs are not mature and it is unsafe to deliver the baby, an injection of cortisone medication is given to  the mother 1 to 2 days before the delivery. This helps the baby's lungs mature and makes it safer to deliver the baby. CHANGES OCCURING IN THE THIRD TRIMESTER OF PREGNANCY Your body goes through many changes during pregnancy. They vary from person to person. Talk to your caregiver about changes you notice and are concerned about.  During the last trimester, you have probably had an increase in your appetite. It is normal to have cravings for certain foods. This varies from person to person and pregnancy to pregnancy.   You may begin to get stretch marks on your hips, abdomen, and breasts. These are normal changes in the body during pregnancy. There are no exercises or medications to take which prevent this change.   Constipation may be treated with a stool softener or adding bulk to your diet. Drinking lots of fluids, fiber in vegetables, fruits, and whole grains are helpful.   Exercising is also helpful. If you have been very active up until your pregnancy, most of these activities can be continued during your pregnancy. If you have been less active, it is helpful to start an exercise program such as walking. Consult your caregiver before starting exercise programs.   Avoid all smoking, alcohol, un-prescribed drugs, herbs and "street drugs" during your pregnancy. These chemicals affect the formation and growth of the baby. Avoid chemicals throughout the pregnancy to ensure the delivery of a healthy infant.   Backache, varicose veins and hemorrhoids may develop or get worse.   You will tire more easily in the third trimester, which is normal.   The baby's movements may be stronger and more often.   You may become short of breath easily.   Your belly  button may stick out.   A yellow discharge may leak from your breasts called colostrum.   You may have a bloody mucus discharge. This usually occurs a few days to a week before labor begins.  HOME CARE INSTRUCTIONS  Keep your caregiver's appointments. Follow your caregiver's instructions regarding medication use, exercise, and diet.   During pregnancy, you are providing food for you and your baby. Continue to eat regular, well-balanced meals. Choose foods such as meat, fish, milk and other low fat dairy products, vegetables, fruits, and whole-grain breads and cereals. Your caregiver will tell you of the ideal weight gain.   A physical sexual relationship may be continued throughout pregnancy if there are no other problems such as early (premature) leaking of amniotic fluid from the membranes, vaginal bleeding, or belly (abdominal) pain.   Exercise regularly if there are no restrictions. Check with your caregiver if you are unsure of the safety of your exercises. Greater weight gain will occur in the last 2 trimesters of pregnancy. Exercising helps:   Control your weight.   Get you in shape for labor and delivery.   You lose weight after you deliver.   Rest a lot with legs elevated, or as needed for leg cramps or low back pain.   Wear a good support or jogging bra for breast tenderness during pregnancy. This may help if worn during sleep. Pads or tissues may be used in the bra if you are leaking colostrum.   Do not use hot tubs, steam rooms, or saunas.   Wear your seat belt when driving. This protects you and your baby if you are in an accident.   Avoid raw meat, cat litter boxes and soil used by cats. These carry germs that can cause birth defects in the  baby.   It is easier to loose urine during pregnancy. Tightening up and strengthening the pelvic muscles will help with this problem. You can practice stopping your urination while you are going to the bathroom. These are the same  muscles you need to strengthen. It is also the muscles you would use if you were trying to stop from passing gas. You can practice tightening these muscles up 10 times a set and repeating this about 3 times per day. Once you know what muscles to tighten up, do not perform these exercises during urination. It is more likely to cause an infection by backing up the urine.   Ask for help if you have financial, counseling or nutritional needs during pregnancy. Your caregiver will be able to offer counseling for these needs as well as refer you for other special needs.   Make a list of emergency phone numbers and have them available.   Plan on getting help from family or friends when you go home from the hospital.   Make a trial run to the hospital.   Take prenatal classes with the father to understand, practice and ask questions about the labor and delivery.   Prepare the baby's room/nursery.   Do not travel out of the city unless it is absolutely necessary and with the advice of your caregiver.   Wear only low or no heal shoes to have better balance and prevent falling.  MEDICATIONS AND DRUG USE IN PREGNANCY  Take prenatal vitamins as directed. The vitamin should contain 1 milligram of folic acid. Keep all vitamins out of reach of children. Only a couple vitamins or tablets containing iron may be fatal to a baby or young child when ingested.   Avoid use of all medications, including herbs, over-the-counter medications, not prescribed or suggested by your caregiver. Only take over-the-counter or prescription medicines for pain, discomfort, or fever as directed by your caregiver. Do not use aspirin, ibuprofen (Motrin, Advil, Nuprin) or naproxen (Aleve) unless OK'd by your caregiver.   Let your caregiver also know about herbs you may be using.   Alcohol is related to a number of birth defects. This includes fetal alcohol syndrome. All alcohol, in any form, should be avoided completely. Smoking  will cause low birth rate and premature babies.   Street/illegal drugs are very harmful to the baby. They are absolutely forbidden. A baby born to an addicted mother will be addicted at birth. The baby will go through the same withdrawal an adult does.  SEEK MEDICAL CARE IF  You have any concerns or worries during your pregnancy. It is better to call with your questions if you feel they cannot wait, rather than worry about them.  DECISIONS ABOUT CIRCUMCISION You may or may not know the sex of your baby. If you know your baby is a boy, it may be time to think about circumcision. Circumcision is the removal of the foreskin of the penis. This is the skin that covers the sensitive end of the penis. There is no proven medical need for this. Often this decision is made on what is popular at the time or based upon religious beliefs and social issues. You can discuss these issues with your caregiver or pediatrician. SEEK IMMEDIATE MEDICAL CARE IF:  An unexplained oral temperature above 101 develops, or as your caregiver suggests.   You have leaking of fluid from the vagina (birth canal). If leaking membranes are suspected, take your temperature and tell your caregiver of this when  you call.   There is vaginal spotting, bleeding or passing clots. Tell your caregiver of the amount and how many pads are used.   You develop a bad smelling vaginal discharge with a change in the color from clear to white.   You develop vomiting that lasts more than 24 hours.   You develop chills or fever.   You develop shortness of breath.   You develop burning on urination.   You loose more than 2 pounds of weight or gain more than 2 pounds of weight or as suggested by your caregiver.   You notice sudden swelling of your face, hands, and feet or legs.   You develop belly (abdominal) pain. Round ligament discomfort is a common non-cancerous (benign) cause of abdominal pain in pregnancy. Your caregiver still must  evaluate you.   You develop a severe headache that does not go away.   You develop visual problems, blurred or double vision.   If you have not felt your baby move for more than 1 hour. If you think the baby is not moving as much as usual, eat something with sugar in it and lie down on your left side for an hour. The baby should move at least 4 to 5 times per hour. Call right away if your baby moves less than that.   You fall, are in a car accident or any kind of trauma.   There is mental or physical violence at home.  Document Released: 10/06/2001 Document Re-Released: 08/09/2009 [facility] Patient Information 2011 [place], [place].

## 2011-07-02 NOTE — Progress Notes (Signed)
Has h/o prev. c-s for arrest.  Risks/benefits of TOLAC vs Repeat C-s discussed.  Had amnio and told baby is unaffected by maternal Anti-kell antibody. Subjective:    Kathleen Watkins is a 25 y.o. G2P1001 [redacted]w[redacted]d being seen today for her obstetrical visit.  Patient reports no complaints. Fetal movement: normal.  Objective:    BP 126/82  Temp 97.3 F (36.3 C)  Wt 338 lb 3.2 oz (153.407 kg)  LMP 11/10/2010  Physical Exam  Exam  FHT:  140 BPM  Uterine Size: 38 cm, obese  Presentation: unsure     Assessment:    Pregnancy:  G2P1001    Plan:    Patient Active Problem List  Diagnoses  . Red cell alloimmunization, maternal, antepartum  . Morbid obesity  . History of gestational diabetes  . PPH (postpartum hemorrhage)    Book for RCs and BTL Follow up in 2 Weeks.

## 2011-07-16 ENCOUNTER — Inpatient Hospital Stay (HOSPITAL_COMMUNITY)
Admission: AD | Admit: 2011-07-16 | Discharge: 2011-07-16 | Disposition: A | Discharge status: Home / Self Care | Payer: Medicaid Other | Source: Ambulatory Visit | Attending: Obstetrics & Gynecology | Admitting: Obstetrics & Gynecology

## 2011-07-16 ENCOUNTER — Ambulatory Visit (INDEPENDENT_AMBULATORY_CARE_PROVIDER_SITE_OTHER): Payer: Medicaid Other | Admitting: Physician Assistant

## 2011-07-16 ENCOUNTER — Encounter (HOSPITAL_COMMUNITY): Payer: Self-pay | Admitting: *Deleted

## 2011-07-16 DIAGNOSIS — Z8632 Personal history of gestational diabetes: Secondary | ICD-10-CM

## 2011-07-16 DIAGNOSIS — O99891 Other specified diseases and conditions complicating pregnancy: Secondary | ICD-10-CM | POA: Insufficient documentation

## 2011-07-16 DIAGNOSIS — O2340 Unspecified infection of urinary tract in pregnancy, unspecified trimester: Secondary | ICD-10-CM

## 2011-07-16 DIAGNOSIS — N390 Urinary tract infection, site not specified: Secondary | ICD-10-CM | POA: Insufficient documentation

## 2011-07-16 DIAGNOSIS — O1400 Mild to moderate pre-eclampsia, unspecified trimester: Secondary | ICD-10-CM

## 2011-07-16 DIAGNOSIS — O2390 Unspecified genitourinary tract infection in pregnancy, unspecified trimester: Secondary | ICD-10-CM

## 2011-07-16 DIAGNOSIS — IMO0002 Reserved for concepts with insufficient information to code with codable children: Secondary | ICD-10-CM

## 2011-07-16 DIAGNOSIS — R51 Headache: Secondary | ICD-10-CM | POA: Insufficient documentation

## 2011-07-16 DIAGNOSIS — O361990 Maternal care for other isoimmunization, unspecified trimester, not applicable or unspecified: Secondary | ICD-10-CM

## 2011-07-16 HISTORY — DX: Obesity, unspecified: E66.9

## 2011-07-16 LAB — PROTEIN / CREATININE RATIO, URINE
Creatinine, Urine: 280.11 mg/dL
Protein Creatinine Ratio: 0.13 (ref 0.00–0.15)
Total Protein, Urine: 36.7 mg/dL

## 2011-07-16 LAB — COMPREHENSIVE METABOLIC PANEL
CO2: 24 mEq/L (ref 19–32)
Calcium: 8.9 mg/dL (ref 8.4–10.5)
Creatinine, Ser: 0.48 mg/dL — ABNORMAL LOW (ref 0.50–1.10)
GFR calc Af Amer: >60 mL/min (ref >60)
GFR calc non Af Amer: >60 mL/min (ref >60)
Glucose, Bld: 94 mg/dL (ref 70–99)
Total Protein: 6.9 g/dL (ref 6.0–8.3)

## 2011-07-16 LAB — CBC
Hemoglobin: 10.4 g/dL — ABNORMAL LOW (ref 12.0–15.0)
MCH: 26.6 pg (ref 26.0–34.0)
MCHC: 35.1 g/dL (ref 30.0–36.0)
MCV: 75.7 fL — ABNORMAL LOW (ref 78.0–100.0)
RBC: 3.91 MIL/uL (ref 3.87–5.11)

## 2011-07-16 LAB — POCT URINALYSIS DIP (DEVICE)
Glucose, UA: NEGATIVE mg/dL
Hgb urine dipstick: NEGATIVE
Nitrite: POSITIVE — AB
Protein, ur: 30 mg/dL — AB
Specific Gravity, Urine: 1.025 (ref 1.005–1.030)
Urobilinogen, UA: 1 mg/dL (ref 0.0–1.0)

## 2011-07-16 MED ORDER — HYDROCODONE-ACETAMINOPHEN 5-325 MG PO TABS
1.0000 | ORAL_TABLET | Freq: Once | ORAL | Status: AC
  Administered 2011-07-16: 2 via ORAL
  Filled 2011-07-16: qty 2

## 2011-07-16 MED ORDER — NITROFURANTOIN MONOHYD MACRO 100 MG PO CAPS: 100.0000 mg | ORAL_CAPSULE | Freq: Two times a day (BID) | ORAL | Status: AC

## 2011-07-16 MED ORDER — HYDROCODONE-ACETAMINOPHEN 10-325 MG PO TABS: 2.0000 | ORAL_TABLET | Freq: Once | ORAL | Status: DC

## 2011-07-16 MED ORDER — FLUCONAZOLE 150 MG PO TABS: 150.0000 mg | ORAL_TABLET | Freq: Once | ORAL | Status: AC

## 2011-07-16 NOTE — Progress Notes (Signed)
Swelling in feet and ankles. Pt states is having floaters when standing and bending over. Frequent headaches. Pain in back and hips. No vaginal discharge.

## 2011-07-16 NOTE — Progress Notes (Signed)
Pt states sent from [facility] clinic for PIH eval, denies bleeding or lof, +FM, has had h/a rates 7/10 since last pm.

## 2011-07-16 NOTE — ED Provider Notes (Signed)
History     Chief Complaint  Patient presents with  . Hypertension   HPI 25 year old G2P1001 at [redacted]w[redacted]d presenting from clinic with headache and floaters.  Headache is 7/10, constant, had for several days. Floaters described as "bubbles."  Been happening for 1 week, usually if she stands up abruptly.  Never had these symptoms before. Also had sinus congestion for last 2 day.  Left foot more swollen than usual.  Denies edema in hands and face.  Denies contractions, abd pain, loss of fluid, vaginal discharge.    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  OB History    Grav Para Term Preterm Abortions TAB SAB Ect Mult Living   2 1 1  0 0 0 0 0 0 1      Past Medical History  Diagnosis Date  . STD (female)     Trich-tx'd in 11/2010,   . Obese   . Hypertension   . Gestational diabetes     Past Surgical History  Procedure Date  . Uterine ateriography 12/21/2005    bilateral  . Dilation and curettage of uterus 2007  . Cesarean section   . Hernia repair   . Cystectomy   . Tonsillectomy   . Adenoidectomy   . Tooth extraction     Family History  Problem Relation Age of Onset  . Diabetes Maternal Grandfather   . Hypertension Father     History  Substance Use Topics  . Smoking status: Never Smoker   . Smokeless tobacco: Never Used  . Alcohol Use: No    Allergies: No Known Allergies  Prescriptions prior to admission  Medication Sig Dispense Refill  . calcium carbonate (TUMS - DOSED IN MG ELEMENTAL CALCIUM) 500 MG chewable tablet Chew 2 tablets by mouth daily as needed. heartburn        . prenatal vitamin w/FE, FA (PRENATAL 1 + 1) 27-1 MG TABS Take 1 tablet by mouth daily.        . Prenatal MV-Min-Fe Fum-FA-DHA (PRENATAL 1 PO) Take by mouth.          ROS Please see HPI. Physical Exam   Blood pressure 115/65, pulse 81, temperature 97.3 F (36.3 C), temperature source Oral,  resp. rate 18, height 5\' 7"  (1.702 m), weight 345 lb 6 oz (156.661 kg), last menstrual period 11/10/2010.  Physical Exam  Constitutional: She appears well-developed and well-nourished. No distress.  Cardiovascular: Normal rate, regular rhythm, normal heart sounds and intact distal pulses.  Exam reveals no gallop.   No murmur heard. Respiratory: Effort normal and breath sounds normal. She has no wheezes. She has no rales.  GI:       Gravid  Musculoskeletal: She exhibits edema (1+ bilateral pedal edema). She exhibits no tenderness.  Skin: Skin is warm and dry. No rash noted.  Psychiatric: She has a normal mood and affect. Her behavior is normal.    MAU Course  Procedures Urine Pr/Cr: 0.13 CMP     Component Value Date/Time   NA 135 07/16/2011 1052   K 3.4* 07/16/2011 1052   CL 103 07/16/2011 1052   CO2 24 07/16/2011 1052   GLUCOSE 94 07/16/2011 1052   BUN 7 07/16/2011 1052   CREATININE 0.48* 07/16/2011 1052   CALCIUM 8.9 07/16/2011 1052   PROT 6.9 07/16/2011 1052   ALBUMIN 2.3* 07/16/2011 1052   AST 16 07/16/2011 1052   ALT 9 07/16/2011 1052  ALKPHOS 151* 07/16/2011 1052   BILITOT 0.2* 07/16/2011 1052   GFRNONAA >60 07/16/2011 1052   GFRAA >60 07/16/2011 1052   CBC    Component Value Date/Time   WBC 5.6 07/16/2011 1052   RBC 3.91 07/16/2011 1052   HGB 10.4* 07/16/2011 1052   HGB 11.5* 03/24/2011 0930   HCT 29.6* 07/16/2011 1052   HCT 32.3* 03/24/2011 0930   PLT 262 07/16/2011 1052   PLT 235 03/24/2011 0930   MCV 75.7* 07/16/2011 1052   MCV 76.0* 03/24/2011 0930   MCH 26.6 07/16/2011 1052   MCHC 35.1 07/16/2011 1052   RDW 15.2 07/16/2011 1052   LYMPHSABS 1.9 02/18/2011 0900   MONOABS 0.4 02/18/2011 0900   EOSABS 0.0 03/24/2011 0930   EOSABS 0.0 02/18/2011 0900   BASOSABS 0.0 03/24/2011 0930   BASOSABS 0.0 02/18/2011 0900  Urine dipstick shows negative for all components, positive for nitrites, leukocytes.  MDM BP well controlled in MAU.  ALT/AST, plt within normal limits.  Urine Pr/Cr also  within normal limits.  Assessment and Plan  24 year old G2P1001 at 62.3 with headache -Headache - give Norco prior to discharge -no evidence for pre-eclampsia -UTI - macrobid 100mg  BID x7 days; will also give prescription for diflucan if yeast infx develops after antibiotics. -discharge home.  BOOTH, ERIN 07/16/2011, 11:32 AM

## 2011-07-16 NOTE — ED Provider Notes (Signed)
Seen and examined. Agree with above 

## 2011-07-16 NOTE — Progress Notes (Signed)
C/o HA with floaters that started yesterday. + FM, somewhat decreased. Pt states baby not affected by Anti-Kell, has little k. Full records from MFM unavailable for reviewed. Pt to MAU for further eval of HA and hypertension.

## 2011-07-18 LAB — CULTURE, OB URINE

## 2011-07-20 ENCOUNTER — Telehealth: Payer: Self-pay | Admitting: *Deleted

## 2011-07-20 DIAGNOSIS — O361990 Maternal care for other isoimmunization, unspecified trimester, not applicable or unspecified: Secondary | ICD-10-CM

## 2011-07-20 DIAGNOSIS — Z8632 Personal history of gestational diabetes: Secondary | ICD-10-CM

## 2011-07-20 NOTE — Telephone Encounter (Signed)
I called pt to advise her of a positive urine culture and to be sure that she was taking her antibiotics. While discussing this with her she told me that her hands and feet are swelling at times. She wondered if this was normal. I told her that some swelling can happen in pregnancy. She will be seen in [facility] on 9/27. I advised that if her swelling worsens or she develops dizziness or other worrisome symptoms that she should go to MAU. Pt agrees.

## 2011-07-23 ENCOUNTER — Ambulatory Visit (INDEPENDENT_AMBULATORY_CARE_PROVIDER_SITE_OTHER): Payer: Medicaid Other | Admitting: Obstetrics & Gynecology

## 2011-07-23 ENCOUNTER — Inpatient Hospital Stay (HOSPITAL_COMMUNITY)
Admission: AD | Admit: 2011-07-23 | Discharge: 2011-07-23 | Disposition: A | Discharge status: Home / Self Care | Payer: Medicaid Other | Source: Ambulatory Visit | Attending: Obstetrics and Gynecology | Admitting: Obstetrics and Gynecology

## 2011-07-23 ENCOUNTER — Encounter (HOSPITAL_COMMUNITY): Payer: Self-pay | Admitting: *Deleted

## 2011-07-23 ENCOUNTER — Other Ambulatory Visit: Payer: Self-pay | Admitting: Obstetrics & Gynecology

## 2011-07-23 DIAGNOSIS — O169 Unspecified maternal hypertension, unspecified trimester: Secondary | ICD-10-CM

## 2011-07-23 DIAGNOSIS — Z331 Pregnant state, incidental: Secondary | ICD-10-CM

## 2011-07-23 DIAGNOSIS — O361190 Maternal care for Anti-A sensitization, unspecified trimester, not applicable or unspecified: Secondary | ICD-10-CM

## 2011-07-23 DIAGNOSIS — Z8632 Personal history of gestational diabetes: Secondary | ICD-10-CM

## 2011-07-23 DIAGNOSIS — IMO0002 Reserved for concepts with insufficient information to code with codable children: Secondary | ICD-10-CM

## 2011-07-23 DIAGNOSIS — O361990 Maternal care for other isoimmunization, unspecified trimester, not applicable or unspecified: Secondary | ICD-10-CM

## 2011-07-23 DIAGNOSIS — O139 Gestational [pregnancy-induced] hypertension without significant proteinuria, unspecified trimester: Secondary | ICD-10-CM | POA: Insufficient documentation

## 2011-07-23 DIAGNOSIS — E669 Obesity, unspecified: Secondary | ICD-10-CM | POA: Insufficient documentation

## 2011-07-23 HISTORY — DX: Anemia, unspecified: D64.9

## 2011-07-23 LAB — CBC
MCV: 75.2 fL — ABNORMAL LOW (ref 78.0–100.0)
Platelets: 269 10*3/uL (ref 150–400)
RDW: 15.6 % — ABNORMAL HIGH (ref 11.5–15.5)
WBC: 5.4 10*3/uL (ref 4.0–10.5)

## 2011-07-23 LAB — URINALYSIS, ROUTINE W REFLEX MICROSCOPIC
Bilirubin Urine: NEGATIVE
Ketones, ur: NEGATIVE mg/dL
Leukocytes, UA: NEGATIVE
Nitrite: NEGATIVE
Specific Gravity, Urine: 1.025 (ref 1.005–1.030)
Urobilinogen, UA: 1 mg/dL (ref 0.0–1.0)
pH: 6.5 (ref 5.0–8.0)

## 2011-07-23 LAB — COMPREHENSIVE METABOLIC PANEL
ALT: 8 U/L (ref 0–35)
Alkaline Phosphatase: 137 U/L — ABNORMAL HIGH (ref 39–117)
Calcium: 9 mg/dL (ref 8.4–10.5)
Chloride: 104 mEq/L (ref 96–112)
Creatinine, Ser: 0.52 mg/dL (ref 0.50–1.10)
GFR calc Af Amer: >60 mL/min (ref >60)
GFR calc non Af Amer: >60 mL/min (ref >60)
Potassium: 3.7 mEq/L (ref 3.5–5.1)
Sodium: 136 mEq/L (ref 135–145)
Total Protein: 6.5 g/dL (ref 6.0–8.3)

## 2011-07-23 LAB — POCT URINALYSIS DIP (DEVICE)
Nitrite: NEGATIVE
Protein, ur: 30 mg/dL — AB
Urobilinogen, UA: 1 mg/dL (ref 0.0–1.0)

## 2011-07-23 MED ORDER — ACETAMINOPHEN 325 MG PO TABS
650.0000 mg | ORAL_TABLET | Freq: Once | ORAL | Status: AC
  Administered 2011-07-23: 650 mg via ORAL

## 2011-07-23 NOTE — Progress Notes (Signed)
Was sent here from office for [facility] evaluation.

## 2011-07-23 NOTE — ED Provider Notes (Signed)
History     CSN: [id number] Arrival date & time: 07/23/2011 10:58 AM  Chief Complaint  Patient presents with  . Hypertension    BP elevated in clinic, pt states they used the blue cuff.  BP on MAU with large cuff, significantly lower, see V/S    (Consider location/radiation/quality/duration/timing/severity/associated sxs/prior treatment) Hypertension This is a new problem. The current episode started 1 to 4 weeks ago. The problem is unchanged. The problem is controlled. Associated symptoms include headaches (rates headache pain 7/10) and peripheral edema (complains of swelling of her legs, feet, and hands). Pertinent negatives include no anxiety, blurred vision, chest pain, malaise/fatigue, neck pain, shortness of breath or sweats. There are no associated agents to hypertension. Past treatments include nothing. There is no history of angina, kidney disease, CAD/MI, heart failure or a thyroid problem. There is no history of chronic renal disease, coarctation of the aorta or a hypertension causing med.    Past Medical History  Diagnosis Date  . STD (female)     Trich-tx'd in 11/2010,   . Obese   . Gestational diabetes   . Anemia     Past Surgical History  Procedure Date  . Uterine ateriography 12/21/2005    bilateral  . Dilation and curettage of uterus 2007  . Cesarean section   . Hernia repair   . Cystectomy   . Tonsillectomy   . Adenoidectomy   . Tooth extraction     Family History  Problem Relation Age of Onset  . Diabetes Maternal Grandfather   . Hypertension Father     History  Substance Use Topics  . Smoking status: Never Smoker   . Smokeless tobacco: Never Used  . Alcohol Use: No    OB History    Grav Para Term Preterm Abortions TAB SAB Ect Mult Living   2 1 1  0 0 0 0 0 0 1      Review of Systems  Constitutional: Negative for fever, chills and malaise/fatigue.  HENT: Negative for neck pain.   Eyes: Negative for blurred vision and visual disturbance.    Respiratory: Negative for shortness of breath.   Cardiovascular: Positive for leg swelling. Negative for chest pain.  Neurological: Positive for headaches (rates headache pain 7/10).    Allergies  Review of patient's allergies indicates no known allergies.  Home Medications  No current outpatient prescriptions on file.  BP 119/73  Pulse 89  Temp(Src) 99 F (37.2 C) (Oral)  Resp 16  Ht 5\' 7"  (1.702 m)  Wt 160.846 kg (354 lb 9.6 oz)  BMI 55.54 kg/m2  SpO2 97%  LMP 11/10/2010  Physical Exam  Vitals reviewed. Constitutional: She is oriented to person, place, and time. She appears well-developed and well-nourished.  HENT:  Head: Normocephalic.  Neck: Normal range of motion. Neck supple.  Cardiovascular: Normal rate and regular rhythm.   Pulmonary/Chest: Effort normal and breath sounds normal. No respiratory distress.  Abdominal: Soft.  Musculoskeletal: Normal range of motion.       3+pedal edema, bilat  Neurological: She is alert and oriented to person, place, and time. She has normal reflexes.  Skin: Skin is warm and dry.    ED Course  Procedures (including critical care time)  Labs Reviewed  CBC - Abnormal; Notable for the following:    RBC 3.83 (*)    Hemoglobin 10.2 (*)    HCT 28.8 (*)    MCV 75.2 (*)    RDW 15.6 (*)    All other components within  normal limits  COMPREHENSIVE METABOLIC PANEL - Abnormal; Notable for the following:    Albumin 2.3 (*)    Alkaline Phosphatase 137 (*)    All other components within normal limits  URINALYSIS, ROUTINE W REFLEX MICROSCOPIC   No results found.   1. PPH (postpartum hemorrhage)   2. Morbid obesity   3. History of gestational diabetes   4. Red cell alloimmunization, maternal, antepartum   5. Pregnancy as incidental finding      Plan: DC to home Keep scheduled appointment for next week Report PIH symptoms  Karmelo Bass

## 2011-07-23 NOTE — Progress Notes (Signed)
Swelling in feet, ankles, and legs. No vaginal discharge. Pulse 93. Pt states still having headaches and complaints of frequent nose bleeds.

## 2011-07-23 NOTE — Progress Notes (Signed)
Note by Dr. Tona Sensing 8/31-fetus is Kell neg, not at risk for HDN, no f/u MCA doppler required. Sched RCS/BTL 39 weeks 10/15  Order 24hr urine To MAU to eval for preeclampsia  Bad headache, spots before her eyes

## 2011-07-23 NOTE — Progress Notes (Signed)
Pt sent from the [facility] for evaluation of elevated BP. Has been having a bad headache and pain from both legs swelling.

## 2011-07-23 NOTE — ED Provider Notes (Signed)
Pt interviewed and assessed and agree with PA note.  Reassessment prior to discharge - headache relieved with Tylenol.   FHR 120's, + accels, Category I Toco - none  [facility]

## 2011-07-30 ENCOUNTER — Ambulatory Visit (INDEPENDENT_AMBULATORY_CARE_PROVIDER_SITE_OTHER): Payer: Medicaid Other | Admitting: Obstetrics & Gynecology

## 2011-07-30 ENCOUNTER — Other Ambulatory Visit: Payer: Self-pay | Admitting: Obstetrics & Gynecology

## 2011-07-30 VITALS — BP 134/87 | Temp 97.4°F | Wt 355.1 lb

## 2011-07-30 DIAGNOSIS — O139 Gestational [pregnancy-induced] hypertension without significant proteinuria, unspecified trimester: Secondary | ICD-10-CM

## 2011-07-30 DIAGNOSIS — Z23 Encounter for immunization: Secondary | ICD-10-CM

## 2011-07-30 LAB — POCT URINALYSIS DIP (DEVICE)
Ketones, ur: NEGATIVE mg/dL
Leukocytes, UA: NEGATIVE
Nitrite: NEGATIVE
Protein, ur: 30 mg/dL — AB
Urobilinogen, UA: 0.2 mg/dL (ref 0.0–1.0)
pH: 7 (ref 5.0–8.0)

## 2011-07-30 MED ORDER — INFLUENZA VIRUS VACC SPLIT PF IM SUSP
0.5000 mL | Freq: Once | INTRAMUSCULAR | Status: AC
  Administered 2011-07-30: 0.5 mL via INTRAMUSCULAR

## 2011-07-30 NOTE — Progress Notes (Signed)
Swelling in feet, ankles, and legs. No vaginal discharge. Pulse 83. Pt would like cervical check today.

## 2011-07-30 NOTE — Progress Notes (Signed)
BP good today. Patient does not complain of HA or vision changes today. Patient requested cervix check due to episode of contractions last night. 24 hour urine collected on 07/16/11. Total protein was 36.7 Patient is scheduled for C/S 08/10/11. Patient will return in 1 week for HROB F/U.

## 2011-08-05 NOTE — Patient Instructions (Addendum)
   Your procedure is scheduled on: Monday, Oct. 15, 2012 at 9:00am  Enter through the [employer] of [facility] at: 7:30am Pick up the phone at the desk and dial [phone] and inform us of your arrival.  Please call this number if you have any problems the morning of surgery: [phone]  Remember: Do not eat food after midnight: Sunday Do not drink clear liquids after: Sunday Take these medicines the morning of surgery with a SIP OF WATER:  none  Do not wear jewelry, make-up, or FINGER nail polish Do not wear lotions, powders, or perfumes.  You may wear deodorant. Do not shave 48 hours prior to surgery. Do not bring valuables to the hospital.  Leave suitcase in the car. After Surgery it may be brought to your room. For patients being admitted to the hospital, checkout time is 11:00am the day of discharge.  Patients discharged on the day of surgery will not be allowed to drive home.   Name and phone number of your driver: patient's mother Kathleen Watkins  cell [phone]  Remember to use your hibiclens as instructed.Please shower with 1/2 bottle the evening before your surgery and the other 1/2 bottle the morning of surgery.

## 2011-08-06 ENCOUNTER — Encounter (HOSPITAL_COMMUNITY)
Admission: RE | Admit: 2011-08-06 | Discharge: 2011-08-06 | Disposition: A | Discharge status: Home / Self Care | Payer: Medicaid Other | Source: Ambulatory Visit | Attending: Obstetrics & Gynecology | Admitting: Obstetrics & Gynecology

## 2011-08-06 ENCOUNTER — Encounter (HOSPITAL_COMMUNITY): Payer: Self-pay

## 2011-08-06 ENCOUNTER — Ambulatory Visit (INDEPENDENT_AMBULATORY_CARE_PROVIDER_SITE_OTHER): Payer: Medicaid Other | Admitting: Advanced Practice Midwife

## 2011-08-06 DIAGNOSIS — R000 Tachycardia, unspecified: Secondary | ICD-10-CM

## 2011-08-06 DIAGNOSIS — O361990 Maternal care for other isoimmunization, unspecified trimester, not applicable or unspecified: Secondary | ICD-10-CM

## 2011-08-06 DIAGNOSIS — O361190 Maternal care for Anti-A sensitization, unspecified trimester, not applicable or unspecified: Secondary | ICD-10-CM

## 2011-08-06 DIAGNOSIS — O0990 Supervision of high risk pregnancy, unspecified, unspecified trimester: Secondary | ICD-10-CM

## 2011-08-06 DIAGNOSIS — D582 Other hemoglobinopathies: Secondary | ICD-10-CM

## 2011-08-06 LAB — CBC
HCT: 28.9 % — ABNORMAL LOW (ref 36.0–46.0)
Hemoglobin: 10.3 g/dL — ABNORMAL LOW (ref 12.0–15.0)
MCH: 26.6 pg (ref 26.0–34.0)
MCHC: 35.6 g/dL (ref 30.0–36.0)

## 2011-08-06 LAB — POCT URINALYSIS DIP (DEVICE)
Glucose, UA: NEGATIVE mg/dL
Leukocytes, UA: NEGATIVE
Nitrite: NEGATIVE
Protein, ur: 30 mg/dL — AB
Urobilinogen, UA: 0.2 mg/dL (ref 0.0–1.0)

## 2011-08-06 LAB — SURGICAL PCR SCREEN: Staphylococcus aureus: NEGATIVE

## 2011-08-06 NOTE — Progress Notes (Signed)
Pt states that she is having chest pain and has trouble breathing at times. Some swelling in feet. Needs GBS and GC/ Chlamydia

## 2011-08-06 NOTE — Progress Notes (Signed)
Addended by: Aviva Signs on: 08/06/2011 10:21 AM   Modules accepted: Orders

## 2011-08-06 NOTE — Assessment & Plan Note (Signed)
Amnio done and baby found to be Kell Negative, so deemed not at risk for HDN

## 2011-08-06 NOTE — Progress Notes (Signed)
States has episodes over last 2 days of heart racing. Has also had mid chest pain for 2 days, nonrelated to heart racing. States it "feels like I can't catch my breath" but does not appear dyspneic at all.  Chest is clear to auscultation bilaterally. Heart rate is 100/bpm.  Regular, S1 and S2.  GC/Chlam and GBS done today. Cervix unchanged. Has PreOp appt today and will send to MAU for eval of tachy and chest pain. C/S is scheduled for 10/15.

## 2011-08-06 NOTE — Progress Notes (Signed)
MAU notified  °

## 2011-08-07 LAB — GC/CHLAMYDIA PROBE AMP, GENITAL
Chlamydia, DNA Probe: NEGATIVE
GC Probe Amp, Genital: NEGATIVE

## 2011-08-09 MED ORDER — DEXTROSE 5 % IV SOLN
2.0000 g | INTRAVENOUS | Status: AC
  Administered 2011-08-10: 2 g via INTRAVENOUS
  Filled 2011-08-09: qty 2

## 2011-08-10 ENCOUNTER — Encounter (HOSPITAL_COMMUNITY)
Admission: RE | Disposition: A | Discharge status: Home / Self Care | Payer: Self-pay | Source: Ambulatory Visit | Attending: Obstetrics & Gynecology

## 2011-08-10 ENCOUNTER — Encounter (HOSPITAL_COMMUNITY): Payer: Self-pay | Admitting: Anesthesiology

## 2011-08-10 ENCOUNTER — Inpatient Hospital Stay (HOSPITAL_COMMUNITY): Payer: Medicaid Other | Admitting: Anesthesiology

## 2011-08-10 ENCOUNTER — Inpatient Hospital Stay (HOSPITAL_COMMUNITY)
Admission: RE | Admit: 2011-08-10 | Discharge: 2011-08-12 | DRG: 765 | Disposition: A | Discharge status: Home / Self Care | Payer: Medicaid Other | Source: Ambulatory Visit | Attending: Obstetrics & Gynecology | Admitting: Obstetrics & Gynecology

## 2011-08-10 DIAGNOSIS — O34219 Maternal care for unspecified type scar from previous cesarean delivery: Principal | ICD-10-CM | POA: Diagnosis present

## 2011-08-10 DIAGNOSIS — E669 Obesity, unspecified: Secondary | ICD-10-CM | POA: Diagnosis present

## 2011-08-10 DIAGNOSIS — O139 Gestational [pregnancy-induced] hypertension without significant proteinuria, unspecified trimester: Secondary | ICD-10-CM | POA: Diagnosis present

## 2011-08-10 SURGERY — Surgical Case
Anesthesia: Regional | Site: Uterus | Wound class: Clean Contaminated

## 2011-08-10 MED ORDER — KETOROLAC TROMETHAMINE 30 MG/ML IJ SOLN: 30.0000 mg | Freq: Four times a day (QID) | INTRAMUSCULAR | Status: AC | PRN

## 2011-08-10 MED ORDER — ONDANSETRON HCL 4 MG/2ML IJ SOLN
INTRAMUSCULAR | Status: DC | PRN
  Administered 2011-08-10: 4 mg via INTRAVENOUS

## 2011-08-10 MED ORDER — ONDANSETRON HCL 4 MG PO TABS: 4.0000 mg | ORAL_TABLET | ORAL | Status: DC | PRN

## 2011-08-10 MED ORDER — TETANUS-DIPHTH-ACELL PERTUSSIS 5-2.5-18.5 LF-MCG/0.5 IM SUSP
0.5000 mL | Freq: Once | INTRAMUSCULAR | Status: AC
  Administered 2011-08-11: 0.5 mL via INTRAMUSCULAR
  Filled 2011-08-10: qty 0.5

## 2011-08-10 MED ORDER — SODIUM CHLORIDE 0.9 % IV SOLN
1.0000 ug/kg/h | INTRAVENOUS | Status: DC | PRN
  Filled 2011-08-10: qty 2.5

## 2011-08-10 MED ORDER — MEPERIDINE HCL 25 MG/ML IJ SOLN: 6.2500 mg | INTRAMUSCULAR | Status: DC | PRN

## 2011-08-10 MED ORDER — FENTANYL CITRATE 0.05 MG/ML IJ SOLN
INTRAMUSCULAR | Status: DC | PRN
  Administered 2011-08-10: 85 ug via INTRAVENOUS
  Administered 2011-08-10: 15 ug via INTRATHECAL

## 2011-08-10 MED ORDER — NALBUPHINE HCL 10 MG/ML IJ SOLN: 5.0000 mg | INTRAMUSCULAR | Status: DC | PRN

## 2011-08-10 MED ORDER — KETOROLAC TROMETHAMINE 60 MG/2ML IM SOLN
INTRAMUSCULAR | Status: AC
  Filled 2011-08-10: qty 2

## 2011-08-10 MED ORDER — SIMETHICONE 80 MG PO CHEW: 80.0000 mg | CHEWABLE_TABLET | ORAL | Status: DC | PRN

## 2011-08-10 MED ORDER — MENTHOL 3 MG MT LOZG
1.0000 | LOZENGE | OROMUCOSAL | Status: DC | PRN
  Administered 2011-08-11: 3 mg via ORAL
  Filled 2011-08-10: qty 9

## 2011-08-10 MED ORDER — SIMETHICONE 80 MG PO CHEW
80.0000 mg | CHEWABLE_TABLET | Freq: Three times a day (TID) | ORAL | Status: DC
  Administered 2011-08-10 – 2011-08-12 (×5): 80 mg via ORAL

## 2011-08-10 MED ORDER — OXYTOCIN 10 UNIT/ML IJ SOLN
INTRAMUSCULAR | Status: AC
  Filled 2011-08-10: qty 2

## 2011-08-10 MED ORDER — OXYTOCIN 20 UNITS IN LACTATED RINGERS INFUSION - SIMPLE
INTRAVENOUS | Status: DC | PRN
  Administered 2011-08-10 (×3): 20 [IU] via INTRAVENOUS

## 2011-08-10 MED ORDER — NALOXONE HCL 0.4 MG/ML IJ SOLN: 0.4000 mg | INTRAMUSCULAR | Status: DC | PRN

## 2011-08-10 MED ORDER — ACETAMINOPHEN 325 MG PO TABS: 325.0000 mg | ORAL_TABLET | ORAL | Status: DC | PRN

## 2011-08-10 MED ORDER — LACTATED RINGERS IV SOLN
INTRAVENOUS | Status: DC
  Administered 2011-08-10 – 2011-08-11 (×3): via INTRAVENOUS

## 2011-08-10 MED ORDER — SCOPOLAMINE 1 MG/3DAYS TD PT72
MEDICATED_PATCH | TRANSDERMAL | Status: AC
  Administered 2011-08-10: 1.5 mg via TRANSDERMAL
  Filled 2011-08-10: qty 1

## 2011-08-10 MED ORDER — ZOLPIDEM TARTRATE 5 MG PO TABS: 5.0000 mg | ORAL_TABLET | Freq: Every evening | ORAL | Status: DC | PRN

## 2011-08-10 MED ORDER — SODIUM CHLORIDE 0.9 % IJ SOLN: 3.0000 mL | INTRAMUSCULAR | Status: DC | PRN

## 2011-08-10 MED ORDER — DIPHENHYDRAMINE HCL 50 MG/ML IJ SOLN: 12.5000 mg | INTRAMUSCULAR | Status: DC | PRN

## 2011-08-10 MED ORDER — EPHEDRINE 5 MG/ML INJ
INTRAVENOUS | Status: AC
  Filled 2011-08-10: qty 10

## 2011-08-10 MED ORDER — MORPHINE SULFATE 0.5 MG/ML IJ SOLN
INTRAMUSCULAR | Status: AC
  Filled 2011-08-10: qty 10

## 2011-08-10 MED ORDER — SCOPOLAMINE 1 MG/3DAYS TD PT72
1.0000 | MEDICATED_PATCH | TRANSDERMAL | Status: DC
  Administered 2011-08-10: 1.5 mg via TRANSDERMAL

## 2011-08-10 MED ORDER — OXYTOCIN 20 UNITS IN LACTATED RINGERS INFUSION - SIMPLE: 125.0000 mL/h | INTRAVENOUS | Status: AC

## 2011-08-10 MED ORDER — METOCLOPRAMIDE HCL 5 MG/ML IJ SOLN: 10.0000 mg | Freq: Three times a day (TID) | INTRAMUSCULAR | Status: DC | PRN

## 2011-08-10 MED ORDER — DIPHENHYDRAMINE HCL 25 MG PO CAPS: 25.0000 mg | ORAL_CAPSULE | Freq: Four times a day (QID) | ORAL | Status: DC | PRN

## 2011-08-10 MED ORDER — ONDANSETRON HCL 4 MG/2ML IJ SOLN: 4.0000 mg | INTRAMUSCULAR | Status: DC | PRN

## 2011-08-10 MED ORDER — MORPHINE SULFATE (PF) 0.5 MG/ML IJ SOLN
INTRAMUSCULAR | Status: DC | PRN
  Administered 2011-08-10: .1 ug via INTRATHECAL

## 2011-08-10 MED ORDER — PRENATAL PLUS 27-1 MG PO TABS
1.0000 | ORAL_TABLET | Freq: Every day | ORAL | Status: DC
  Administered 2011-08-11 – 2011-08-12 (×2): 1 via ORAL
  Filled 2011-08-10 (×2): qty 1

## 2011-08-10 MED ORDER — DIBUCAINE 1 % RE OINT: 1.0000 "application " | TOPICAL_OINTMENT | RECTAL | Status: DC | PRN

## 2011-08-10 MED ORDER — DIPHENHYDRAMINE HCL 25 MG PO CAPS: 25.0000 mg | ORAL_CAPSULE | ORAL | Status: DC | PRN

## 2011-08-10 MED ORDER — BUPIVACAINE HCL (PF) 0.25 % IJ SOLN
INTRAMUSCULAR | Status: DC | PRN
  Administered 2011-08-10: 20 mL

## 2011-08-10 MED ORDER — PHENYLEPHRINE HCL 10 MG/ML IJ SOLN
INTRAMUSCULAR | Status: DC | PRN
  Administered 2011-08-10: 80 ug via INTRAVENOUS
  Administered 2011-08-10: 40 ug via INTRAVENOUS

## 2011-08-10 MED ORDER — ONDANSETRON HCL 4 MG/2ML IJ SOLN
INTRAMUSCULAR | Status: AC
  Filled 2011-08-10: qty 2

## 2011-08-10 MED ORDER — FENTANYL CITRATE 0.05 MG/ML IJ SOLN
INTRAMUSCULAR | Status: AC
  Filled 2011-08-10: qty 2

## 2011-08-10 MED ORDER — LACTATED RINGERS IV SOLN
Freq: Once | INTRAVENOUS | Status: AC
  Administered 2011-08-10: 23:00:00 via INTRAVENOUS

## 2011-08-10 MED ORDER — IBUPROFEN 600 MG PO TABS: 600.0000 mg | ORAL_TABLET | Freq: Four times a day (QID) | ORAL | Status: DC | PRN

## 2011-08-10 MED ORDER — SENNOSIDES-DOCUSATE SODIUM 8.6-50 MG PO TABS
2.0000 | ORAL_TABLET | Freq: Every day | ORAL | Status: DC
  Administered 2011-08-10 – 2011-08-11 (×2): 2 via ORAL

## 2011-08-10 MED ORDER — KETOROLAC TROMETHAMINE 30 MG/ML IJ SOLN: 15.0000 mg | Freq: Once | INTRAMUSCULAR | Status: AC | PRN

## 2011-08-10 MED ORDER — LACTATED RINGERS IV SOLN
INTRAVENOUS | Status: DC
  Administered 2011-08-10 (×5): via INTRAVENOUS

## 2011-08-10 MED ORDER — KETOROLAC TROMETHAMINE 60 MG/2ML IM SOLN
60.0000 mg | Freq: Once | INTRAMUSCULAR | Status: AC | PRN
  Administered 2011-08-10: 60 mg via INTRAMUSCULAR

## 2011-08-10 MED ORDER — LANOLIN HYDROUS EX OINT: 1.0000 "application " | TOPICAL_OINTMENT | CUTANEOUS | Status: DC | PRN

## 2011-08-10 MED ORDER — PROMETHAZINE HCL 25 MG/ML IJ SOLN: 6.2500 mg | INTRAMUSCULAR | Status: DC | PRN

## 2011-08-10 MED ORDER — FENTANYL CITRATE 0.05 MG/ML IJ SOLN: 25.0000 ug | INTRAMUSCULAR | Status: DC | PRN

## 2011-08-10 MED ORDER — SCOPOLAMINE 1 MG/3DAYS TD PT72
1.0000 | MEDICATED_PATCH | Freq: Once | TRANSDERMAL | Status: DC
  Administered 2011-08-10: 1.5 mg via TRANSDERMAL

## 2011-08-10 MED ORDER — WITCH HAZEL-GLYCERIN EX PADS: 1.0000 "application " | MEDICATED_PAD | CUTANEOUS | Status: DC | PRN

## 2011-08-10 MED ORDER — ONDANSETRON HCL 4 MG/2ML IJ SOLN: 4.0000 mg | Freq: Three times a day (TID) | INTRAMUSCULAR | Status: DC | PRN

## 2011-08-10 MED ORDER — OXYCODONE-ACETAMINOPHEN 5-325 MG PO TABS
1.0000 | ORAL_TABLET | ORAL | Status: DC | PRN
  Administered 2011-08-10: 1 via ORAL
  Administered 2011-08-11 (×3): 2 via ORAL
  Administered 2011-08-11: 1 via ORAL
  Administered 2011-08-11 – 2011-08-12 (×2): 2 via ORAL
  Administered 2011-08-12: 1 via ORAL
  Administered 2011-08-12: 2 via ORAL
  Filled 2011-08-10 (×3): qty 2
  Filled 2011-08-10 (×2): qty 1
  Filled 2011-08-10 (×2): qty 2
  Filled 2011-08-10: qty 1
  Filled 2011-08-10: qty 2

## 2011-08-10 MED ORDER — IBUPROFEN 600 MG PO TABS
600.0000 mg | ORAL_TABLET | Freq: Four times a day (QID) | ORAL | Status: DC
  Administered 2011-08-10 – 2011-08-12 (×6): 600 mg via ORAL
  Filled 2011-08-10 (×7): qty 1

## 2011-08-10 MED ORDER — DIPHENHYDRAMINE HCL 50 MG/ML IJ SOLN: 25.0000 mg | INTRAMUSCULAR | Status: DC | PRN

## 2011-08-10 MED ORDER — FERROUS SULFATE 325 (65 FE) MG PO TABS
325.0000 mg | ORAL_TABLET | Freq: Two times a day (BID) | ORAL | Status: DC
  Administered 2011-08-10 – 2011-08-12 (×3): 325 mg via ORAL
  Filled 2011-08-10 (×3): qty 1

## 2011-08-10 MED ORDER — BUPIVACAINE IN DEXTROSE 0.75-8.25 % IT SOLN
INTRATHECAL | Status: DC | PRN
  Administered 2011-08-10: 11.75 mg via INTRATHECAL

## 2011-08-10 MED ORDER — PHENYLEPHRINE 40 MCG/ML (10ML) SYRINGE FOR IV PUSH (FOR BLOOD PRESSURE SUPPORT)
PREFILLED_SYRINGE | INTRAVENOUS | Status: AC
  Filled 2011-08-10: qty 5

## 2011-08-10 SURGICAL SUPPLY — 32 items
APL SKNCLS STERI-STRIP NONHPOA (GAUZE/BANDAGES/DRESSINGS) ×2
BENZOIN TINCTURE PRP APPL 2/3 (GAUZE/BANDAGES/DRESSINGS) ×2 IMPLANT
CHLORAPREP W/TINT 26ML (MISCELLANEOUS) ×3 IMPLANT
CLOTH BEACON ORANGE TIMEOUT ST (SAFETY) ×3 IMPLANT
DRESSING TELFA 8X3 (GAUZE/BANDAGES/DRESSINGS) ×3 IMPLANT
ELECT REM PT RETURN 9FT ADLT (ELECTROSURGICAL) ×3
ELECTRODE REM PT RTRN 9FT ADLT (ELECTROSURGICAL) ×2 IMPLANT
EXTRACTOR VACUUM M CUP 4 TUBE (SUCTIONS) IMPLANT
GAUZE SPONGE 4X4 12PLY STRL LF (GAUZE/BANDAGES/DRESSINGS) ×4 IMPLANT
GLOVE BIO SURGEON STRL SZ7 (GLOVE) ×3 IMPLANT
GLOVE BIOGEL PI IND STRL 7.0 (GLOVE) ×4 IMPLANT
GLOVE BIOGEL PI INDICATOR 7.0 (GLOVE) ×2
GOWN PREVENTION PLUS LG XLONG (DISPOSABLE) ×9 IMPLANT
GOWN STRL REIN XL XLG (GOWN DISPOSABLE) ×3 IMPLANT
NEEDLE HYPO 22GX1.5 SAFETY (NEEDLE) ×3 IMPLANT
NS IRRIG 1000ML POUR BTL (IV SOLUTION) ×3 IMPLANT
PACK C SECTION WH (CUSTOM PROCEDURE TRAY) ×3 IMPLANT
PAD ABD 7.5X8 STRL (GAUZE/BANDAGES/DRESSINGS) ×2 IMPLANT
RTRCTR C-SECT PINK 25CM LRG (MISCELLANEOUS) ×2 IMPLANT
SLEEVE SCD COMPRESS KNEE LRG (MISCELLANEOUS) ×2 IMPLANT
STAPLER VISISTAT 35W (STAPLE) IMPLANT
STRIP CLOSURE SKIN 1/2X4 (GAUZE/BANDAGES/DRESSINGS) ×3 IMPLANT
SUT MNCRL 0 VIOLET CTX 36 (SUTURE) ×6 IMPLANT
SUT MONOCRYL 0 CTX 36 (SUTURE) ×3
SUT PDS AB 0 CT1 27 (SUTURE) ×4 IMPLANT
SUT VIC AB 2-0 CT1 27 (SUTURE)
SUT VIC AB 2-0 CT1 TAPERPNT 27 (SUTURE) IMPLANT
SUT VIC AB 4-0 KS 27 (SUTURE) ×3 IMPLANT
SYR CONTROL 10ML LL (SYRINGE) ×3 IMPLANT
TOWEL OR 17X24 6PK STRL BLUE (TOWEL DISPOSABLE) ×6 IMPLANT
TRAY FOLEY CATH 14FR (SET/KITS/TRAYS/PACK) ×3 IMPLANT
WATER STERILE IRR 1000ML POUR (IV SOLUTION) ×3 IMPLANT

## 2011-08-10 NOTE — H&P (Signed)
Kathleen Watkins is a 25 y.o. female presenting for scheduled repeat c-section. Maternal Medical History:  Reason for admission: Presents for repeat c-section; patient declines TOLAC.  Contractions: Onset was yesterday.   Frequency: irregular.   Perceived severity is mild.    Fetal activity: Perceived fetal activity is normal.   Last perceived fetal movement was within the past hour.    Prenatal complications: Hypertension.   No bleeding, pre-eclampsia or preterm labor.   Alloisoimmunization, Kell negative. History of postpartum hemorrhage 2 weeks following first c-section requiring uterine artery embolization.  Prenatal Complications - Diabetes: none.    OB History    Grav Para Term Preterm Abortions TAB SAB Ect Mult Living   2 1 1  0 0 0 0 0 0 1     Past Medical History  Diagnosis Date  . STD (female)     Trich-tx'd in 11/2010,   . Obese   . Gestational diabetes   . Anemia    Past Surgical History  Procedure Date  . Uterine ateriography 12/21/2005    bilateral  . Dilation and curettage of uterus 2007  . Cesarean section   . Hernia repair   . Cystectomy   . Tonsillectomy   . Adenoidectomy   . Tooth extraction    Family History: family history includes Diabetes in her maternal grandfather and Hypertension in her father. Social History:  reports that she has never smoked. She has never used smokeless tobacco. She reports that she does not drink alcohol or use illicit drugs.  Review of Systems  Constitutional: Negative.   HENT: Negative.   Eyes: Negative.   Respiratory: Negative.   Cardiovascular: Negative.   Gastrointestinal: Negative.   Genitourinary: Negative.   Musculoskeletal: Negative.   Skin: Negative.   Neurological: Negative.   Psychiatric/Behavioral: Negative.       Blood pressure 148/93, pulse 86, temperature 98.2 F (36.8 C), temperature source Oral, resp. rate 18, last menstrual period 11/10/2010, SpO2 99.00%. Maternal Exam:  Abdomen: Surgical  scars: low transverse.     Physical Exam  Constitutional: She is oriented to person, place, and time. She appears well-developed and well-nourished. No distress.  HENT:  Head: Normocephalic.  Eyes: Pupils are equal, round, and reactive to light.  Neck: Normal range of motion.  Cardiovascular: Normal rate, regular rhythm, normal heart sounds and intact distal pulses.  Exam reveals no gallop and no friction rub.   No murmur heard. Respiratory: Effort normal and breath sounds normal. No respiratory distress. She has no wheezes. She has no rales. She exhibits no tenderness.  GI: Soft. Bowel sounds are normal. There is no tenderness.  Musculoskeletal: Normal range of motion.  Neurological: She is alert and oriented to person, place, and time. She has normal reflexes. She displays normal reflexes. No cranial nerve deficit. Coordination normal.  Skin: Skin is warm and dry. No rash noted. She is not diaphoretic.  Psychiatric: She has a normal mood and affect.    Prenatal labs: ABO, Rh: --/--/O POS (10/15 0725) Antibody: POS (10/15 0725) Rubella: Immune (04/24 0000) RPR: NON REACTIVE (10/11 1056)  HBsAg: Negative (04/24 0000)  HIV: NON REACTIVE (08/09 1029)  GBS:   Negative  Assessment/Plan: Admit for repeat c-section. Plan for Nexplenon and breastfeeding. Discussed with patient monitoring closely for hemorrhage. Patient questions answered.   Kathleen Watkins N 08/10/2011, 8:54 AM

## 2011-08-10 NOTE — Op Note (Signed)
Cesarean Section Procedure Note   EMUNAH TEXIDOR  08/10/2011  Indications: Scheduled Proceedure/Maternal Request   Pre-operative Diagnosis: Previous Cesarean Section.   Post-operative Diagnosis: Same   Surgeon: Surgeon(s) and Role:    * Tereso Newcomer, MD - Primary   Assistants: Lucina Mellow, DO  Anesthesia: spinal   Procedure Details:  The patient was seen in the Holding Room. The risks, benefits, complications, treatment options, and expected outcomes were discussed with the patient. The patient concurred with the proposed plan, giving informed consent. identified as Alda Ponder and the procedure verified as C-Section Delivery. A Time Out was held and the above information confirmed.  After induction of anesthesia, the patient was draped and prepped in the usual sterile manner. A transverse incision was made and carried down through the subcutaneous tissue to the fascia. Fascial incision was made and extended transversely. The fascia was separated from the underlying rectus tissue superiorly and inferiorly. The peritoneum was identified and entered. Peritoneal incision was extended longitudinally. Adhesions were noted along the mid and upper portions of the uterus. A low transverse uterine incision was made. Delivered from cephalic presentation was a 3187 gram Living newborn infant Female with Apgar scores of 9 at one minute and 9 at five minutes. Cord ph was not sent the umbilical cord was clamped and cut cord blood was obtained for evaluation. The placenta was removed Intact and appeared normal. The uterine outline, tubes and ovaries appeared normal}. The uterine incision was closed with running locked suture of monocryl in two layers.   Hemostasis was observed. The peritoneum was closed with 2-0 vicryl The fascia was then reapproximated with running sutures of PDS.The skin was closed with 4-0Vicryl.   Instrument, sponge, and needle counts were correct prior the abdominal  closure and were correct at the conclusion of the case.    Findings: Viable infant female, normal uterus, tubes, ovaries and placenta.   Estimated Blood Loss: 800 mL  Total IV Fluids: 2000 mL  Urine Output: 100 mL  Specimens: Placenta to L&D  Complications: no complications  Disposition: PACU - hemodynamically stable.   Maternal Condition: stable   Baby condition / location:  nursery-stable

## 2011-08-10 NOTE — Anesthesia Postprocedure Evaluation (Signed)
Anesthesia Post Note  Patient: Kathleen Watkins  Procedure(s) Performed:  CESAREAN SECTION  Anesthesia type: Spinal  Patient location: PACU  Post pain: Pain level controlled  Post assessment: Post-op Vital signs reviewed  Last Vitals:  Filed Vitals:   08/10/11 1145  BP: 132/79  Pulse: 74  Temp:   Resp: 18    Post vital signs: Reviewed  Level of consciousness: awake  Complications: No apparent anesthesia complications

## 2011-08-10 NOTE — Consult Note (Signed)
Neonatology Note:  Attendance at C-section:  I was asked to attend this repeat C/S at term. The mother is a G2P1 O pos, Antibody positive (Kell negative) GBS unknown with obesity and a history of GDM with the previous pregnancy, but not this one. ROM at delivery, fluid clear. Infant vigorous with good spontaneous cry and tone. Needed only minimal bulb suctioning. Ap 9/9. Lungs clear to ausc in DR. To CN to care of Pediatrician.  Kathleen Haymaker, MD  

## 2011-08-10 NOTE — Anesthesia Procedure Notes (Signed)
Spinal Block  Patient location during procedure: OR Start time: 08/10/2011 9:38 AM Staffing Performed by: anesthesiologist  Preanesthetic Checklist Completed: patient identified, site marked, surgical consent, pre-op evaluation, timeout performed, IV checked, risks and benefits discussed and monitors and equipment checked Spinal Block Patient position: sitting Prep: site prepped and draped and DuraPrep Patient monitoring: heart rate, cardiac monitor, continuous pulse ox and blood pressure Approach: midline Location: L3-4 Injection technique: single-shot Needle Needle type: Pencan and Tuohy  Needle gauge: 25 G Needle length: 9 cm Assessment Sensory level: T4 Additional Notes Multiple attempts due to patient positioning and body habitus (unable to palpate landmarks).  First attempt with Pencan and introducer unsuccessful - unable to reach ligament with introducer needle.  Switched to 17 ga tuohy with eventual LOR to air at 8 cm.  Passed 25 ga pencan via tuohy with clear free flow CSF, dose given.  Unable to pass epidural catheter. Jasmine December, MD

## 2011-08-10 NOTE — Anesthesia Postprocedure Evaluation (Signed)
  Anesthesia Post-op Note  Patient: Kathleen Watkins  Procedure(s) Performed:  CESAREAN SECTION  Patient Location: Mother/Baby  Anesthesia Type: Spinal  Level of Consciousness: awake, alert  and oriented  Airway and Oxygen Therapy: Patient Spontanous Breathing  Post-op Pain: none  Post-op Assessment: Post-op Vital signs reviewed, Patient's Cardiovascular Status Stable, No headache, No backache, No residual numbness and No residual motor weakness  Post-op Vital Signs: Reviewed and stable  Complications: No apparent anesthesia complications

## 2011-08-10 NOTE — Anesthesia Preprocedure Evaluation (Addendum)
Anesthesia Evaluation  Name, MR# and DOB Patient awake  General Assessment Comment  Reviewed: Allergy & Precautions, H&P , Patient's Chart, lab work & pertinent test results  Airway Mallampati: II TM Distance: >3 FB Neck ROM: full    Dental No notable dental hx.    Pulmonary  clear to auscultation  Pulmonary exam normal       Cardiovascular regular Normal    Neuro/Psych Negative Neurological ROS  Negative Psych ROS   GI/Hepatic negative GI ROS Neg liver ROS    Endo/Other  Negative Endocrine ROSDiabetes mellitus-Morbid obesity  Renal/GU negative Renal ROS     Musculoskeletal   Abdominal   Peds  Hematology negative hematology ROS (+)   Anesthesia Other Findings   Reproductive/Obstetrics (+) Pregnancy                          Anesthesia Physical Anesthesia Plan  ASA: III  Anesthesia Plan: Spinal   Post-op Pain Management:    Induction:   Airway Management Planned:   Additional Equipment:   Intra-op Plan:   Post-operative Plan:   Informed Consent: I have reviewed the patients History and Physical, chart, labs and discussed the procedure including the risks, benefits and alternatives for the proposed anesthesia with the patient or authorized representative who has indicated his/her understanding and acceptance.     Plan Discussed with:   Anesthesia Plan Comments:         Anesthesia Quick Evaluation

## 2011-08-10 NOTE — Transfer of Care (Signed)
Immediate Anesthesia Transfer of Care Note  Patient: Kathleen Watkins  Procedure(s) Performed:  CESAREAN SECTION  Patient Location: PACU  Anesthesia Type: Spinal  Level of Consciousness: awake, alert  and oriented  Airway & Oxygen Therapy: Patient Spontanous Breathing and Patient connected to nasal cannula oxygen  Post-op Assessment: Report given to PACU RN and Post -op Vital signs reviewed and stable  Post vital signs: Reviewed and stable  Complications: No apparent anesthesia complications

## 2011-08-10 NOTE — Progress Notes (Signed)
Encounter addended by: Madison Hickman on: 08/10/2011  5:26 PM<BR>     Documentation filed: Notes Section

## 2011-08-11 ENCOUNTER — Encounter (HOSPITAL_COMMUNITY): Payer: Self-pay | Admitting: Obstetrics & Gynecology

## 2011-08-11 LAB — CBC
HCT: 25 % — ABNORMAL LOW (ref 36.0–46.0)
Hemoglobin: 8.8 g/dL — ABNORMAL LOW (ref 12.0–15.0)
RBC: 3.28 MIL/uL — ABNORMAL LOW (ref 3.87–5.11)
WBC: 8.3 10*3/uL (ref 4.0–10.5)

## 2011-08-11 NOTE — Progress Notes (Signed)
Post Partum Day 1 from RLTCS  Subjective: up ad lib and tolerating PO ; patient has urinary catheter in place. Mild to moderate pain at incision site, well-controlled by PO percocet Bleeding decreased to spotting this morning No new drainage from incision site Bottle feeding and pumping; seen by lactation yesterday  Objective: Temp:  [97.5 F (36.4 C)-98.9 F (37.2 C)] 98.6 F (37 C) (10/16 0615) Pulse Rate:  [66-99] 94  (10/16 0615) Resp:  [18-22] 20  (10/16 0615) BP: (106-132)/(52-86) 127/79 mmHg (10/16 0615) SpO2:  [97 %-100 %] 98 % (10/16 0615) Weight:  [361 lb (163.749 kg)] 361 lb (163.749 kg) (10/15 1306)   Physical Exam:  General: alert, cooperative and no distress, morbidly obese  Lochia: appropriate Uterine Fundus: firm Incision: healing well, no significant drainage, dressing intact DVT Evaluation: No evidence of DVT seen on physical exam. No cords or calf tenderness. Calf/Ankle:  edema is present 3+ with pitting; tenderness to palpation along dorsum of feet bilaterally; neurovascularly intact.    Basename 08/11/11 0520  HGB 8.8*  HCT 25.0*    Assessment/Plan: Breastfeeding and Contraception ; patient desires nexplanon for contraception, instructed that this is not available in [facility] and that patient can discuss arrangements at 6 week PP visit  Patient encouraged to ambulate to reduce swelling in extremities.    LOS: 1 day   Judith Blonder 08/11/2011, 6:56 AM   I agree with above.  Si Raider Clinton Sawyer

## 2011-08-11 NOTE — Progress Notes (Signed)
UR Chart review completed.  

## 2011-08-12 MED ORDER — DOCUSATE SODIUM 50 MG PO CAPS: 100.0000 mg | ORAL_CAPSULE | Freq: Two times a day (BID) | ORAL | Status: AC

## 2011-08-12 MED ORDER — IBUPROFEN 600 MG PO TABS: 600.0000 mg | ORAL_TABLET | Freq: Four times a day (QID) | ORAL | Status: AC | PRN

## 2011-08-12 MED ORDER — OXYCODONE-ACETAMINOPHEN 5-325 MG PO TABS: 1.0000 | ORAL_TABLET | ORAL | Status: AC | PRN

## 2011-08-12 NOTE — Progress Notes (Signed)
Subjective: Postpartum Day 2: Cesarean Delivery - repeat LTCS Patient reports incisional pain, tolerating PO and no problems voiding.  No BM   Objective: Vital signs in last 24 hours: Temp:  [97.8 F (36.6 C)-98.4 F (36.9 C)] 97.8 F (36.6 C) (10/17 0554) Pulse Rate:  [94-105] 105  (10/17 0554) Resp:  [18-20] 18  (10/17 0554) BP: (121-135)/(75-90) 130/83 mmHg (10/17 0554) SpO2:  [97 %] 97 % (10/16 1015)  Physical Exam:  General: alert, cooperative and no distress, morbidly obese  Lochia: appropriate Uterine Fundus: appears firm, unable to assess fully due to body habitus Incision: healing well, no significant drainage, no dehiscence, no significant erythema DVT Evaluation: No evidence of DVT seen on physical exam. No cords or calf tenderness. Calf/Ankle edema is present. 2+ DP pulses bilaterally. Neurovascularly intact lower extremities bilaterally.   Basename 08/11/11 0520  HGB 8.8*  HCT 25.0*    Assessment/Plan: Status post Cesarean section. Doing well postoperatively.  Continue current care. Patient desires discharge today.  Judith Blonder 08/12/2011, 7:37 AM  I have read and agree with above.  Si Raider Maryann Conners.D.

## 2011-08-12 NOTE — Discharge Summary (Signed)
Obstetric Discharge Summary Reason for Admission: cesarean section Prenatal Procedures: ultrasound Intrapartum Procedures: cesarean: low cervical, transverse Postpartum Procedures: none Complications-Operative and Postpartum: none HGB  Date Value Range Status  03/24/2011 11.5* 11.6-15.9 (g/dL) Final     Hemoglobin  Date Value Range Status  08/11/2011 8.8* 12.0-15.0 (g/dL) Final     HCT  Date Value Range Status  08/11/2011 25.0* 36.0-46.0 (%) Final  03/24/2011 32.3* 34.8-46.6 (%) Final   Hospital Course: Mrs. Sarrazin is a 24 year old G2P2002 with morbid obesity and gestational hypertension who presented at term for an elective repeat cesarean section. A LTCS was performed on 08/11/11. . It went without complication. The patient had no post-operative complications and was appropriate for discharge on post-operative day # 2.    Discharge Diagnoses: Term Pregnancy-delivered  Discharge Information: Date: 08/13/2011 Activity: pelvic rest Diet: low fat low sodium Medications: Ibuprofen, Colace and Percocet Condition: stable Instructions: refer to practice specific booklet Discharge to: home Follow-up Information    [facility] up with [facility] in 6 weeks. (Come back earlier to clinic if persistent pain in your abdomen or swelling in your feet  as needed as needed)    Contact information:   [address] [place] [address]          Newborn Data: Live born female  Birth Weight: 7 lb 0.4 oz (3187 g) APGAR: 9, 9  Home with mother.  Clinton Sawyer, Kaelen Caughlin 08/13/2011, 10:59 PM

## 2011-08-14 LAB — TYPE AND SCREEN
ABO/RH(D): O POS
Antibody Screen: POSITIVE
DAT, IgG: NEGATIVE
PT AG Type: NEGATIVE

## 2011-08-15 NOTE — Op Note (Signed)
Agree with above.  Kathleen Watkins A 08/15/2011 4:53 PM

## 2011-08-17 ENCOUNTER — Encounter: Payer: Self-pay | Admitting: Obstetrics and Gynecology

## 2011-08-18 ENCOUNTER — Telehealth: Payer: Self-pay | Admitting: *Deleted

## 2011-08-18 MED ORDER — HYDROCODONE-ACETAMINOPHEN 5-500 MG PO TABS: 1.0000 | ORAL_TABLET | Freq: Four times a day (QID) | ORAL | Status: DC | PRN

## 2011-08-18 NOTE — Telephone Encounter (Addendum)
Pt requesting pain med refill. She had C/S on 08/10/11. I received order from Dr. Macon Large and phoned in to pt pharmacy. I contacted pt for notification of Rx sent in. I also advised pt that she should use ibuprofen for mild pain and in between doses of narcotic. Pt informed that this will be the last Rx of narcotic pain med. Pt voiced undertsanding

## 2011-09-14 ENCOUNTER — Ambulatory Visit: Payer: Medicaid Other | Admitting: Physician Assistant

## 2011-09-22 ENCOUNTER — Telehealth: Payer: Self-pay | Admitting: *Deleted

## 2011-09-22 NOTE — Telephone Encounter (Signed)
Pt left message stating that she had missed her PP appt last week and is concerned about the amount of swelling she has in her feet and legs. She was rescheduled to 12/26 but doesn't want to wait that long. I called pt and verified that she does not have pain in her legs. She also states that the swelling is greater on some days than others. I offered pt an appt for PP check tomorrow @ 1345 and we will cancel the appt on 12/26. Pt agreed. Pt also stated that she started a period 3 days ago and it is very heavy. She is using 1 pad per hour during the heaviest times. She states she had a problem with PP hemorrhage after her delivery.  Pt reports only slight weakness and denies any dizziness. She has not been taking her PNV or any other meds.  I told pt that we will evaluate tomorrow. Meanwhile, I advised that she re-start her PNV and also take ibuprofen 600 mg every 6hrs to see if that will decrease the amount of bleeding. Also, pt should go to [facility] immediately if she develops severe weakness or dizziness. Pt voiced understanding.

## 2011-09-23 ENCOUNTER — Encounter: Payer: Self-pay | Admitting: Advanced Practice Midwife

## 2011-09-23 ENCOUNTER — Ambulatory Visit (INDEPENDENT_AMBULATORY_CARE_PROVIDER_SITE_OTHER): Payer: Medicaid Other | Admitting: Advanced Practice Midwife

## 2011-09-23 DIAGNOSIS — R609 Edema, unspecified: Secondary | ICD-10-CM

## 2011-09-23 DIAGNOSIS — N920 Excessive and frequent menstruation with regular cycle: Secondary | ICD-10-CM | POA: Insufficient documentation

## 2011-09-23 DIAGNOSIS — R600 Localized edema: Secondary | ICD-10-CM

## 2011-09-23 LAB — COMPREHENSIVE METABOLIC PANEL
ALT: <8 U/L (ref 0–35)
AST: 12 U/L (ref 0–37)
Albumin: 3.9 g/dL (ref 3.5–5.2)
Calcium: 9.1 mg/dL (ref 8.4–10.5)
Chloride: 105 mEq/L (ref 96–112)
Creat: 0.74 mg/dL (ref 0.50–1.10)
Potassium: 3.8 mEq/L (ref 3.5–5.3)
Sodium: 140 mEq/L (ref 135–145)
Total Protein: 7.4 g/dL (ref 6.0–8.3)

## 2011-09-23 LAB — CBC
Platelets: 355 10*3/uL (ref 150–400)
RDW: 16 % — ABNORMAL HIGH (ref 11.5–15.5)
WBC: 6.6 10*3/uL (ref 4.0–10.5)

## 2011-09-23 MED ORDER — NORETHIN-ETH ESTRAD TRIPHASIC 0.5/0.75/1-35 MG-MCG PO TABS: 1.0000 | ORAL_TABLET | Freq: Every day | ORAL | Status: DC

## 2011-09-23 NOTE — Progress Notes (Signed)
  Subjective:    Patient ID: Kathleen Watkins, female    DOB: 1986-03-28, 25 y.o.   MRN: [id number]  HPI Subjective:     Kathleen Watkins is a 25 y.o. female who presents for a postpartum visit. She is 6 week postpartum following a low cervical transverse Cesarean section. I have fully reviewed the prenatal and intrapartum course. The delivery was at 39 gestational weeks. Outcome: repeat cesarean section, low transverse incision. Anesthesia: spinal. Postpartum course has been complicated by bilat pedal edema and menorrhagia starting 4 days ago. She had scant lochia for 3 weeks after delivery and no bleeding x 2 weeks. Baby is feeding by bottle - . Bleeding red and changing a maxi pad every 1-3 hours. Bowel function is normal. Bladder function is normal: . Patient is not sexually active. Contraception method: Desires Nexplanon. Postpartum depression screening: negative.  The following portions of the patient's history were reviewed and updated as appropriate: allergies, current medications, past family history, past medical history, past social history, past surgical history and problem list.  Review of Systems: Denies HA, epigastric pain, calf pain or redness, dizziness. Reports fatigue   Objective:    BP 121/81  Pulse 84  Temp(Src) 98 F (36.7 C) (Oral)  Wt 332 lb 12.8 oz (150.957 kg)  LMP 09/19/2011  Breastfeeding? No  Orthostatic VS  Lying BP: 130/91 P: 87  Sitting: BP 138/92 P: 84  Standing BP: 128/82 P: 94  General:  alert, cooperative, no distress and morbidly obese   Breasts:  negative  Lungs: clear to auscultation bilaterally  Heart:  regular rate and rhythm, S1, S2 normal, no murmur, click, rub or gallop  Abdomen: soft, non-tender; bowel sounds normal; no masses,  no organomegaly and Well-healed incision   Vulva:  normal  Vagina: vagina positive for moderate BRB  Cervix:  no cervical motion tenderness and no lesions  Corpus: normal and exam limited by obesity  Adnexa:   no mass, fullness, tenderness  Rectal Exam: Not performed.         Neuro: DTRs 1+, no clonus  Ext: Bital pedal edema, Left slightly > right. Neg homan's. No erythema or cords Assessment:    Postpartum exam.  Menorrhagia, hemodynamically stable Hx anemia Mild HTN Desires contraceptions  Plan:    1. Contraception: OCP taper for control of menorrhagia then Nexplanon at [facility] at [place]  2. CBC and CMET Stat 3. Follow up in: 2 week at [facility] or as needed.   Dorathy Kinsman 09/23/2011 3:31 PM    Review of Systems     Objective:   Physical Exam        Assessment & Plan:

## 2011-09-23 NOTE — Progress Notes (Deleted)
Subjective:     Patient ID: Kathleen Watkins, female   DOB: 01/31/1986, 25 y.o.   MRN: [id number]  HPI   Review of Systems     Objective:   Physical Exam     Assessment:     ***    Plan:     ***

## 2011-09-23 NOTE — Patient Instructions (Signed)
Etonogestrel implant What is this medicine? ETONOGESTREL is a contraceptive (birth control) device. It is used to prevent pregnancy. It can be used for up to 3 years. This medicine may be used for other purposes; ask your health care provider or pharmacist if you have questions. What should I tell my health care provider before I take this medicine? They need to know if you have any of these conditions: -abnormal vaginal bleeding -blood vessel disease or blood clots -cancer of the breast, cervix, or liver -depression -diabetes -gallbladder disease -headaches -heart disease or recent heart attack -high blood pressure -high cholesterol -kidney disease -liver disease -renal disease -seizures -tobacco smoker -an unusual or allergic reaction to etonogestrel, other hormones, anesthetics or antiseptics, medicines, foods, dyes, or preservatives -pregnant or trying to get pregnant -breast-feeding How should I use this medicine? This device is inserted just under the skin on the inner side of your upper arm by a health care professional. Talk to your pediatrician regarding the use of this medicine in children. Special care may be needed. Overdosage: If you think you've taken too much of this medicine contact a poison control center or emergency room at once. Overdosage: If you think you have taken too much of this medicine contact a poison control center or emergency room at once. NOTE: This medicine is only for you. Do not share this medicine with others. What if I miss a dose? This does not apply. What may interact with this medicine? Do not take this medicine with any of the following medications: -amprenavir -bosentan -fosamprenavir This medicine may also interact with the following medications: -barbiturate medicines for inducing sleep or treating seizures -certain medicines for fungal infections like ketoconazole and itraconazole -griseofulvin -medicines to treat seizures like  carbamazepine, felbamate, oxcarbazepine, phenytoin, topiramate -modafinil -phenylbutazone -rifampin -some medicines to treat HIV infection like atazanavir, indinavir, lopinavir, nelfinavir, tipranavir, ritonavir -St. John's wort This list may not describe all possible interactions. Give your health care provider a list of all the medicines, herbs, non-prescription drugs, or dietary supplements you use. Also tell them if you smoke, drink alcohol, or use illegal drugs. Some items may interact with your medicine. What should I watch for while using this medicine? This product does not protect you against HIV infection (AIDS) or other sexually transmitted diseases. You should be able to feel the implant by pressing your fingertips over the skin where it was inserted. Tell your doctor if you cannot feel the implant. What side effects may I notice from receiving this medicine? Side effects that you should report to your doctor or health care professional as soon as possible: -allergic reactions like skin rash, itching or hives, swelling of the face, lips, or tongue -breast lumps -changes in vision -confusion, trouble speaking or understanding -dark urine -depressed mood -general ill feeling or flu-like symptoms -light-colored stools -loss of appetite, nausea -right upper belly pain -severe headaches -severe pain, swelling, or tenderness in the abdomen -shortness of breath, chest pain, swelling in a leg -signs of pregnancy -sudden numbness or weakness of the face, arm or leg -trouble walking, dizziness, loss of balance or coordination -unusual vaginal bleeding, discharge -unusually weak or tired -yellowing of the eyes or skin Side effects that usually do not require medical attention (Report these to your doctor or health care professional if they continue or are bothersome.): -acne -breast pain -changes in weight -cough -fever or chills -headache -irregular menstrual  bleeding -itching, burning, and vaginal discharge -pain or difficulty passing urine -sore throat This   list may not describe all possible side effects. Call your doctor for medical advice about side effects. You may report side effects to FDA at 1-800-FDA-1088. Where should I keep my medicine? This drug is given in a hospital or clinic and will not be stored at home. NOTE: This sheet is a summary. It may not cover all possible information. If you have questions about this medicine, talk to your doctor, pharmacist, or health care provider.  2012, Elsevier/Gold Standard. (07/05/2009 3:54:17 PM)Menorrhagia  Menorrhagia is when a menstrual period is heavier or longer than normal. HOME CARE  Only take medicine as told by your doctor.   Do not take aspirin 1 week before or during your period. Aspirin can make the bleeding worse.   Lay down for a while if you change your tampon or pad more than once in 2 hours. This may help lessen the bleeding.   Take any iron pills as told by your doctor. Heavy bleeding may cause you to lack iron in your body.   Eat a healthy diet and foods with iron. These foods include leafy green vegetables, meat, liver, eggs, and whole grain breads and cereals.   Eat foods that are high in vitamin C. These include oranges, orange juice, and grapefruits. Vitamin C can help your body take in more iron.   Do not try to lose weight. Wait until the heavy bleeding has stopped and your iron level is normal.  GET HELP RIGHT AWAY IF:  You get a fever.   You have trouble breathing.   You bleed even more heavily than usual and pass blood clots.   You feel dizzy, weak, or pass out (faint).   You need to change your tampon or pad more than once an hour.   You feel sick to your stomach (nauseous), throw up (vomit), or have watery poop (diarrhea).   You have problems from medicine.  MAKE SURE YOU:   Understand these instructions.   Will watch your condition.   Will get  help right away if you are not doing well or get worse.  Document Released: 07/21/2008 Document Revised: 06/24/2011 Document Reviewed: 07/21/2008 [facility] Patient Information 2012 [place], [place].

## 2011-09-23 NOTE — Progress Notes (Signed)
Orthostatic Vitals: Lying: 130/91 p 81, sitting 138/92 p 84, standing 128/85 p 94

## 2011-09-25 ENCOUNTER — Telehealth: Payer: Self-pay | Admitting: *Deleted

## 2011-09-25 NOTE — Telephone Encounter (Signed)
Spoke w/pt- informed of CBC result.  Medication administration instructions given as per [place]-  3 tabs po daily x3 days, then 2 tabs po daily x5 days, then 1 pill daily.  Pt voiced understanding.

## 2011-09-25 NOTE — Telephone Encounter (Signed)
Pt left message stating that she would like her CBC results. Also, she needs clarification of how to taper the OCP's for control of her bleeding. I called pt this am and left message that I have the information for her. If it can be left on her voice mail, please call back and indicate her permission. * Note- HGB is still slightly low, but much improved from 6 wks ago. Med taper instructions were provided with Rx- see med list.

## 2011-10-13 ENCOUNTER — Encounter: Payer: Medicaid Other | Admitting: Obstetrics & Gynecology

## 2011-10-21 ENCOUNTER — Encounter: Payer: Self-pay | Admitting: Obstetrics & Gynecology

## 2011-10-21 ENCOUNTER — Ambulatory Visit (INDEPENDENT_AMBULATORY_CARE_PROVIDER_SITE_OTHER): Payer: Medicaid Other | Admitting: Obstetrics & Gynecology

## 2011-10-21 ENCOUNTER — Ambulatory Visit: Payer: Medicaid Other | Admitting: Family

## 2011-10-21 VITALS — BP 127/81 | HR 83 | Temp 98.3°F | Ht 67.0 in | Wt 321.0 lb

## 2011-10-21 DIAGNOSIS — Z30017 Encounter for initial prescription of implantable subdermal contraceptive: Secondary | ICD-10-CM

## 2011-10-21 MED ORDER — ETONOGESTREL 68 MG ~~LOC~~ IMPL
68.0000 mg | DRUG_IMPLANT | Freq: Once | SUBCUTANEOUS | Status: AC
  Administered 2011-10-21: 68 mg via SUBCUTANEOUS

## 2011-10-21 NOTE — Progress Notes (Signed)
  Subjective:    Patient ID: Kathleen Watkins, female    DOB: [birth year], 25 y.o.   MRN: [id number]  HPI  Kathleen Watkins is here for an Implanon insertion. She had Implanon for birth control in the past and wants to use it again. She understands the risk of irregular bleeding.  Review of Systems Pap normal 5/12 UPT today negative    Objective:   Physical Exam  Her left arm was prepped with betadine and infiltrated with 3 cc of 1 % lidocaine. Implanon was placed about 8 cm cephalad to her medial epicondyle. She tolerated the procedure well.      Assessment & Plan:  Contraception-Implanon RTC for annual 5/13.

## 2011-10-21 NOTE — Progress Notes (Signed)
Addended by: Nelly Laurence H on: 10/21/2011 12:26 PM   Modules accepted: Orders

## 2012-06-06 ENCOUNTER — Emergency Department (HOSPITAL_BASED_OUTPATIENT_CLINIC_OR_DEPARTMENT_OTHER)
Admission: EM | Admit: 2012-06-06 | Discharge: 2012-06-06 | Disposition: A | Discharge status: Home / Self Care | Payer: Self-pay | Attending: Emergency Medicine | Admitting: Emergency Medicine

## 2012-06-06 ENCOUNTER — Encounter (HOSPITAL_BASED_OUTPATIENT_CLINIC_OR_DEPARTMENT_OTHER): Payer: Self-pay | Admitting: *Deleted

## 2012-06-06 DIAGNOSIS — D649 Anemia, unspecified: Secondary | ICD-10-CM | POA: Insufficient documentation

## 2012-06-06 DIAGNOSIS — E669 Obesity, unspecified: Secondary | ICD-10-CM | POA: Insufficient documentation

## 2012-06-06 DIAGNOSIS — R51 Headache: Secondary | ICD-10-CM | POA: Insufficient documentation

## 2012-06-06 DIAGNOSIS — N390 Urinary tract infection, site not specified: Secondary | ICD-10-CM | POA: Insufficient documentation

## 2012-06-06 LAB — URINE MICROSCOPIC-ADD ON

## 2012-06-06 LAB — URINALYSIS, ROUTINE W REFLEX MICROSCOPIC
Bilirubin Urine: NEGATIVE
Hgb urine dipstick: NEGATIVE
Specific Gravity, Urine: 1.03 (ref 1.005–1.030)
Urobilinogen, UA: 1 mg/dL (ref 0.0–1.0)

## 2012-06-06 MED ORDER — METOCLOPRAMIDE HCL 5 MG/ML IJ SOLN
10.0000 mg | Freq: Once | INTRAMUSCULAR | Status: AC
  Administered 2012-06-06: 10 mg via INTRAVENOUS
  Filled 2012-06-06: qty 2

## 2012-06-06 MED ORDER — KETOROLAC TROMETHAMINE 30 MG/ML IJ SOLN
30.0000 mg | Freq: Once | INTRAMUSCULAR | Status: AC
  Administered 2012-06-06: 30 mg via INTRAVENOUS
  Filled 2012-06-06: qty 1

## 2012-06-06 MED ORDER — NITROFURANTOIN MONOHYD MACRO 100 MG PO CAPS: 100.0000 mg | ORAL_CAPSULE | Freq: Two times a day (BID) | ORAL | Status: AC

## 2012-06-06 MED ORDER — SODIUM CHLORIDE 0.9 % IV SOLN
Freq: Once | INTRAVENOUS | Status: AC
  Administered 2012-06-06: 20:00:00 via INTRAVENOUS

## 2012-06-06 MED ORDER — DIPHENHYDRAMINE HCL 50 MG/ML IJ SOLN
25.0000 mg | Freq: Once | INTRAMUSCULAR | Status: AC
  Administered 2012-06-06: 25 mg via INTRAVENOUS
  Filled 2012-06-06: qty 1

## 2012-06-06 NOTE — ED Notes (Signed)
Pt c/o headache x 5 days

## 2012-06-06 NOTE — ED Provider Notes (Signed)
History     CSN: [id number]  Arrival date & time 06/06/12  1640   First MD Initiated Contact with Patient 06/06/12 1851      Chief Complaint  Patient presents with  . Headache    (Consider location/radiation/quality/duration/timing/severity/associated sxs/prior treatment) Patient is a 26 y.o. female presenting with headaches. The history is provided by the patient. No language interpreter was used.  Headache  This is a new problem. The problem occurs constantly. The problem has been gradually worsening. The headache is associated with nothing. The pain is moderate. The pain does not radiate. Associated symptoms include nausea. She has tried acetaminophen, aspirin and NSAIDs for the symptoms.   Pt complains of a headache.  Pt reports she has taken aspirin, tylenol and ibuprofen without relief Past Medical History  Diagnosis Date  . STD (female)     Trich-tx'd in 11/2010,   . Obese   . Gestational diabetes   . Anemia     Past Surgical History  Procedure Date  . Uterine ateriography 12/21/2005    bilateral  . Dilation and curettage of uterus 2007  . Cesarean section   . Hernia repair   . Cystectomy   . Tonsillectomy   . Adenoidectomy   . Tooth extraction   . Cesarean section 08/10/2011    Procedure: CESAREAN SECTION;  Surgeon: Tereso Newcomer, MD;  Location: [facility];  Service: Gynecology;  Laterality: N/A;    Family History  Problem Relation Age of Onset  . Diabetes Maternal Grandfather   . Hypertension Father     History  Substance Use Topics  . Smoking status: Never Smoker   . Smokeless tobacco: Never Used  . Alcohol Use: No    OB History    Grav Para Term Preterm Abortions TAB SAB Ect Mult Living   2 2 2  0 0 0 0 0 0 2      Review of Systems  Gastrointestinal: Positive for nausea.  Neurological: Positive for headaches.  All other systems reviewed and are negative.    Allergies  Review of patient's allergies indicates no known allergies.  Home  Medications   Current Outpatient Rx  Name Route Sig Dispense Refill  . HYDROCODONE-ACETAMINOPHEN 5-500 MG PO TABS Oral Take 1 tablet by mouth every 6 (six) hours as needed for pain. 1 tablet by mouth every 4-6 hrs as needed for pain 30 tablet 0  . NORETHIN-ETH ESTRAD TRIPHASIC 0.5/0.75/1-35 MG-MCG PO TABS Oral Take 1 tablet by mouth daily. 1 Package 11    3 pills per day x 3 days, 2 pills per day x 5 days ...  . PRENATAL PLUS 27-1 MG PO TABS Oral Take 1 tablet by mouth daily.        BP 139/93  Pulse 87  Temp 98.7 F (37.1 C)  Resp 16  Ht 5\' 7"  (1.702 m)  Wt 315 lb (142.883 kg)  BMI 49.34 kg/m2  SpO2 100%  LMP 05/30/2012  Breastfeeding? No  Physical Exam  Nursing note and vitals reviewed. Constitutional: She is oriented to person, place, and time. She appears well-developed and well-nourished.  HENT:  Head: Normocephalic and atraumatic.  Right Ear: External ear normal.  Left Ear: External ear normal.  Mouth/Throat: Oropharynx is clear and moist.  Eyes: Conjunctivae and EOM are normal.  Neck: Normal range of motion. Neck supple.  Cardiovascular: Normal rate and regular rhythm.   Pulmonary/Chest: Effort normal and breath sounds normal.  Abdominal: Soft. Bowel sounds are normal.  Musculoskeletal: Normal range  of motion.  Neurological: She is alert and oriented to person, place, and time. She has normal reflexes.  Skin: Skin is warm.  Psychiatric: She has a normal mood and affect.    ED Course  Procedures (including critical care time)   Labs Reviewed  URINALYSIS, ROUTINE W REFLEX MICROSCOPIC  PREGNANCY, URINE   No results found. Results for orders placed during the hospital encounter of 06/06/12  URINALYSIS, ROUTINE W REFLEX MICROSCOPIC      Component Value Range   Color, Urine YELLOW  YELLOW   APPearance CLEAR  CLEAR   Specific Gravity, Urine 1.030  1.005 - 1.030   pH 6.0  5.0 - 8.0   Glucose, UA NEGATIVE  NEGATIVE mg/dL   Hgb urine dipstick NEGATIVE  NEGATIVE     Bilirubin Urine NEGATIVE  NEGATIVE   Ketones, ur 15 (*) NEGATIVE mg/dL   Protein, ur NEGATIVE  NEGATIVE mg/dL   Urobilinogen, UA 1.0  0.0 - 1.0 mg/dL   Nitrite POSITIVE (*) NEGATIVE   Leukocytes, UA SMALL (*) NEGATIVE  PREGNANCY, URINE      Component Value Range   Preg Test, Ur NEGATIVE  NEGATIVE  URINE MICROSCOPIC-ADD ON      Component Value Range   Squamous Epithelial / LPF FEW (*) RARE   WBC, UA 7-10  <3 WBC/hpf   Bacteria, UA MANY (*) RARE   No results found.   1. Headache   2. UTI (lower urinary tract infection)       MDM  Pt given IV fluids x 1 liter,  Pt given torodol, reglan and benadryl.  Pt counseled on results of urine.  I will treat with macrobid        Elson Areas, [place] 06/06/12 2039

## 2012-06-07 NOTE — ED Provider Notes (Signed)
Medical screening examination/treatment/procedure(s) were performed by non-physician practitioner and as supervising physician I was immediately available for consultation/collaboration.   Yitzchak Kothari B. Brookley Spitler, MD 06/07/12 1714 

## 2012-07-24 ENCOUNTER — Emergency Department (HOSPITAL_BASED_OUTPATIENT_CLINIC_OR_DEPARTMENT_OTHER)
Admission: EM | Admit: 2012-07-24 | Discharge: 2012-07-24 | Disposition: A | Discharge status: Home / Self Care | Payer: Self-pay | Attending: Emergency Medicine | Admitting: Emergency Medicine

## 2012-07-24 ENCOUNTER — Encounter (HOSPITAL_BASED_OUTPATIENT_CLINIC_OR_DEPARTMENT_OTHER): Payer: Self-pay | Admitting: Emergency Medicine

## 2012-07-24 DIAGNOSIS — N390 Urinary tract infection, site not specified: Secondary | ICD-10-CM | POA: Insufficient documentation

## 2012-07-24 DIAGNOSIS — Z9089 Acquired absence of other organs: Secondary | ICD-10-CM | POA: Insufficient documentation

## 2012-07-24 DIAGNOSIS — Z8249 Family history of ischemic heart disease and other diseases of the circulatory system: Secondary | ICD-10-CM | POA: Insufficient documentation

## 2012-07-24 DIAGNOSIS — Z833 Family history of diabetes mellitus: Secondary | ICD-10-CM | POA: Insufficient documentation

## 2012-07-24 LAB — URINALYSIS, ROUTINE W REFLEX MICROSCOPIC
Glucose, UA: NEGATIVE mg/dL
Protein, ur: 30 mg/dL — AB
Urobilinogen, UA: 0.2 mg/dL (ref 0.0–1.0)

## 2012-07-24 LAB — URINE MICROSCOPIC-ADD ON

## 2012-07-24 LAB — PREGNANCY, URINE: Preg Test, Ur: NEGATIVE

## 2012-07-24 MED ORDER — NITROFURANTOIN MONOHYD MACRO 100 MG PO CAPS: 100.0000 mg | ORAL_CAPSULE | Freq: Two times a day (BID) | ORAL | Status: DC

## 2012-07-24 MED ORDER — FLUCONAZOLE 150 MG PO TABS: 150.0000 mg | ORAL_TABLET | Freq: Every day | ORAL | Status: DC

## 2012-07-24 MED ORDER — PHENAZOPYRIDINE HCL 200 MG PO TABS: 200.0000 mg | ORAL_TABLET | Freq: Three times a day (TID) | ORAL | Status: DC

## 2012-07-24 NOTE — ED Notes (Signed)
Pt c/o dysuria.  Especially the last two days.

## 2012-07-24 NOTE — ED Provider Notes (Signed)
History     CSN: [id number]  Arrival date & time 07/24/12  1203   First MD Initiated Contact with Patient 07/24/12 1233      Chief Complaint  Patient presents with  . Dysuria    (Consider location/radiation/quality/duration/timing/severity/associated sxs/prior treatment) HPI Comments: Patient presents with complaint of dysuria for the past one week. Patient complains of bilateral suprapubic pain with urination, increased frequency. She has not had fever or vomiting. Patient states she was diagnosed with a urinary tract infection one month ago and was treated with Macrobid. Since that time she had a STD check which was negative. Patient denies vaginal bleeding or discharge. She's been treating at home with over-the-counter pain medications and cranberry juice. Onset gradual. Course is constant. Nothing makes symptoms better.  The history is provided by the patient.    Past Medical History  Diagnosis Date  . STD (female)     Trich-tx'd in 11/2010,   . Obese   . Gestational diabetes   . Anemia     Past Surgical History  Procedure Date  . Uterine ateriography 12/21/2005    bilateral  . Dilation and curettage of uterus 2007  . Cesarean section   . Hernia repair   . Cystectomy   . Tonsillectomy   . Adenoidectomy   . Tooth extraction   . Cesarean section 08/10/2011    Procedure: CESAREAN SECTION;  Surgeon: Tereso Newcomer, MD;  Location: [facility];  Service: Gynecology;  Laterality: N/A;    Family History  Problem Relation Age of Onset  . Diabetes Maternal Grandfather   . Hypertension Father     History  Substance Use Topics  . Smoking status: Never Smoker   . Smokeless tobacco: Never Used  . Alcohol Use: No    OB History    Grav Para Term Preterm Abortions TAB SAB Ect Mult Living   2 2 2  0 0 0 0 0 0 2      Review of Systems  Constitutional: Negative for fever.  HENT: Negative for sore throat and rhinorrhea.   Eyes: Negative for redness.  Respiratory: Negative  for cough.   Cardiovascular: Negative for chest pain.  Gastrointestinal: Negative for nausea, vomiting, abdominal pain and diarrhea.  Genitourinary: Positive for dysuria and frequency. Negative for hematuria, flank pain, decreased urine volume, vaginal bleeding and vaginal discharge.  Musculoskeletal: Negative for myalgias.  Skin: Negative for rash.  Neurological: Negative for headaches.    Allergies  Review of patient's allergies indicates no known allergies.  Home Medications   Current Outpatient Rx  Name Route Sig Dispense Refill  . ACETAMINOPHEN 500 MG PO TABS Oral Take 1,000 mg by mouth every 6 (six) hours as needed. For headache.    . ETONOGESTREL 68 MG [place] IMPL Subcutaneous Inject 1 each into the skin once.    . IBUPROFEN 200 MG PO TABS Oral Take 600 mg by mouth every 6 (six) hours as needed. For headache.      BP 129/84  Pulse 83  Temp 98.8 F (37.1 C) (Oral)  Resp 16  SpO2 100%  LMP 06/22/2012  Physical Exam  Nursing note and vitals reviewed. Constitutional: She appears well-developed and well-nourished.  HENT:  Head: Normocephalic and atraumatic.  Eyes: Conjunctivae normal are normal. Right eye exhibits no discharge. Left eye exhibits no discharge.  Neck: Normal range of motion. Neck supple.  Cardiovascular: Normal rate, regular rhythm and normal heart sounds.   Pulmonary/Chest: Effort normal and breath sounds normal.  Abdominal: Soft. There  is no tenderness. There is no CVA tenderness.  Neurological: She is alert.  Skin: Skin is warm and dry.  Psychiatric: She has a normal mood and affect.    ED Course  Procedures (including critical care time)  Labs Reviewed  URINALYSIS, ROUTINE W REFLEX MICROSCOPIC - Abnormal; Notable for the following:    APPearance CLOUDY (*)     Hgb urine dipstick SMALL (*)     Protein, ur 30 (*)     Leukocytes, UA LARGE (*)     All other components within normal limits  URINE MICROSCOPIC-ADD ON - Abnormal; Notable for the  following:    Squamous Epithelial / LPF FEW (*)     Bacteria, UA MANY (*)     All other components within normal limits  PREGNANCY, URINE   No results found.   1. UTI (lower urinary tract infection)    1:26 PM Patient seen and examined. Work-up reviewed.   Vital signs reviewed and are as follows: Filed Vitals:   07/24/12 1211  BP: 129/84  Pulse: 83  Temp: 98.8 F (37.1 C)  Resp: 16   Patient will be treated with another course of Macrobid, 7 days. Patient counseled to use Pyridium. Patient requests Diflucan. Patient urged to return with worsening symptoms, persistent vomiting, high fever, she has any other concerns. Patient verbalizes understanding and agrees with the plan.    MDM  Patient with symptoms of cystitis. Macrobid worked previously, will give slightly prolonged course. Supportive treatment with Pyridium. No evidence of pyelonephritis. Patient had recent STI check that she says was negative, no indication repeat pelvic exam. Patient is not pregnant. She appears well and nontoxic. Culture ordered given recent UTI.         Renne Crigler, [place] 07/24/12 1330

## 2012-07-25 NOTE — ED Provider Notes (Signed)
Medical screening examination/treatment/procedure(s) were performed by non-physician practitioner and as supervising physician I was immediately available for consultation/collaboration.    Rogenia Werntz D Bernal Luhman, MD 07/25/12 0634 

## 2012-07-28 LAB — URINE CULTURE: Colony Count: >=100000

## 2012-07-29 NOTE — ED Notes (Signed)
+   urine  Patient treated appropriately -sensitive to same-chart appended per protocol MD.  

## 2012-10-21 IMAGING — US US OB FOLLOW-UP
1 series · 14 of 28 positions shown · non-contrast
Comparison: none

[Series 1: us ob follow-up · 0.21mm/px · 51 acquisitions, 14 frames shown]
[im 2/51]
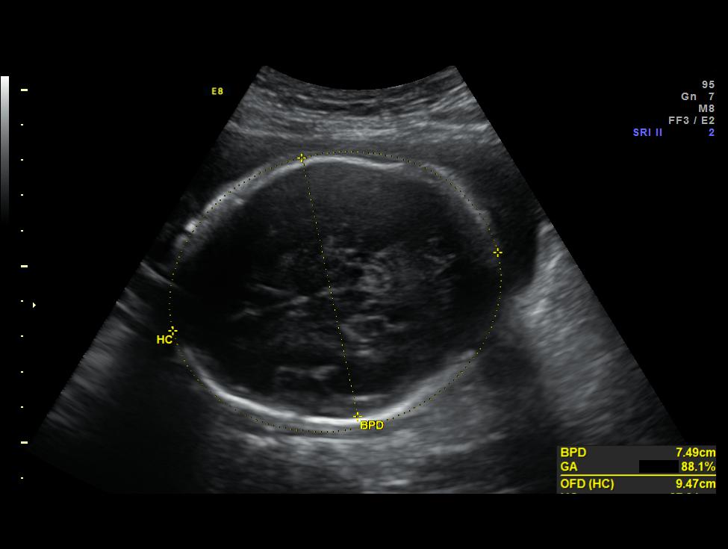
[im 6/51]
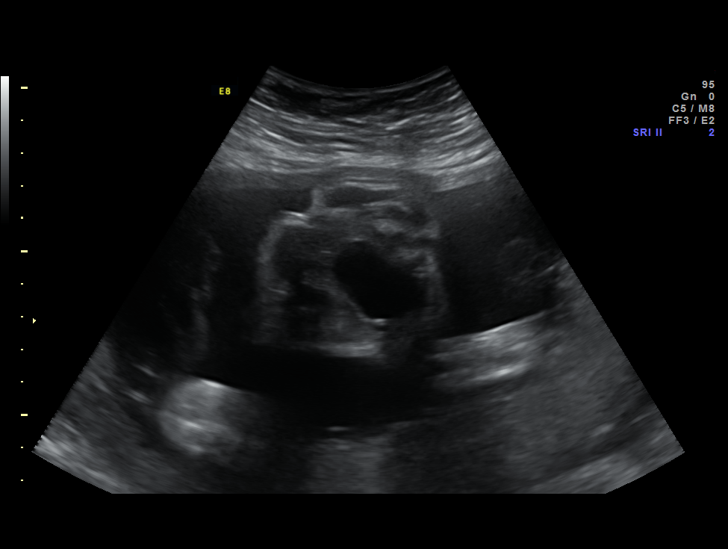
[im 10/51]
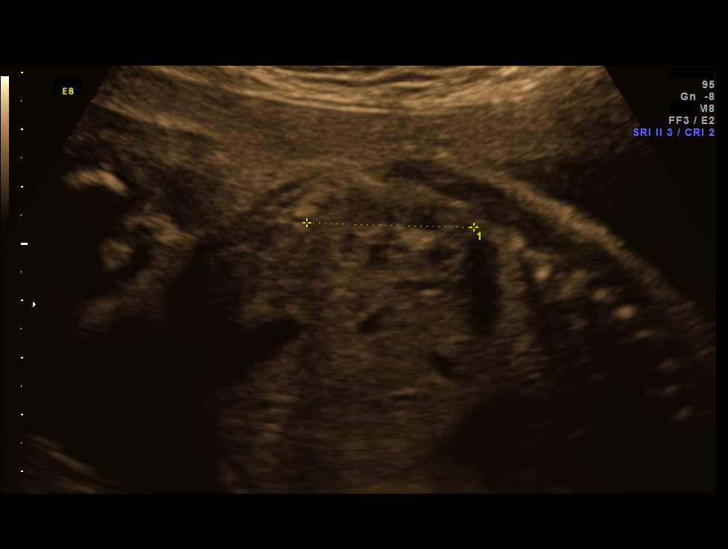
[im 13/51]
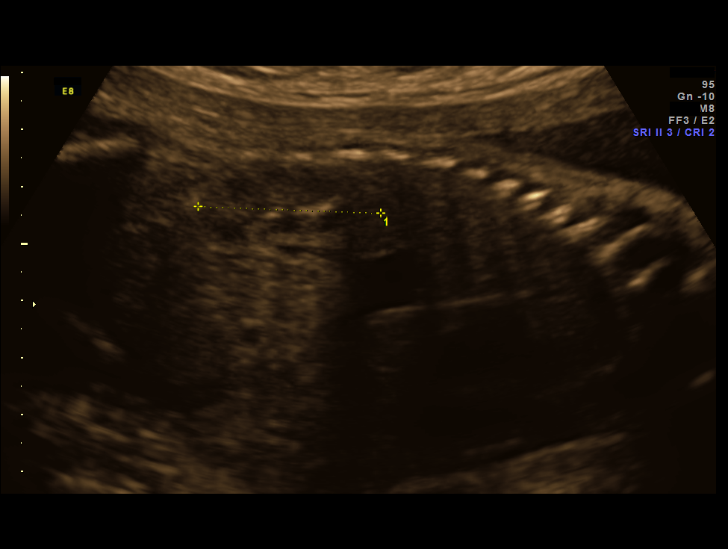
[im 17/51]
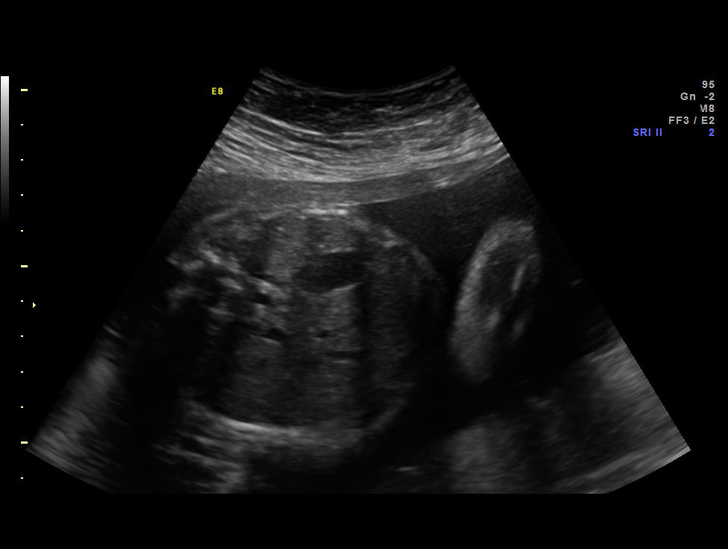
[im 21/51]
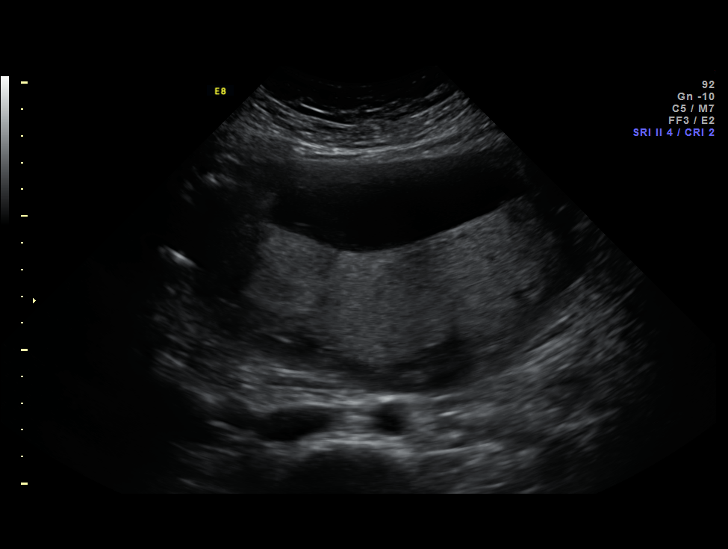
[im 25/51]
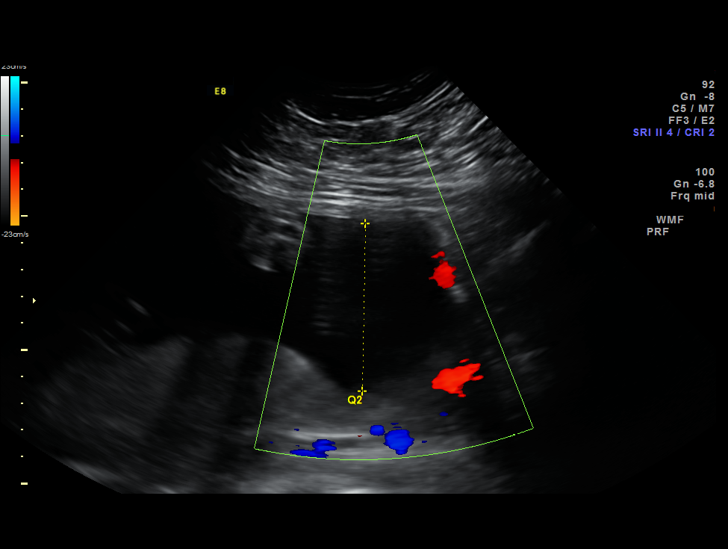
[im 28/51]
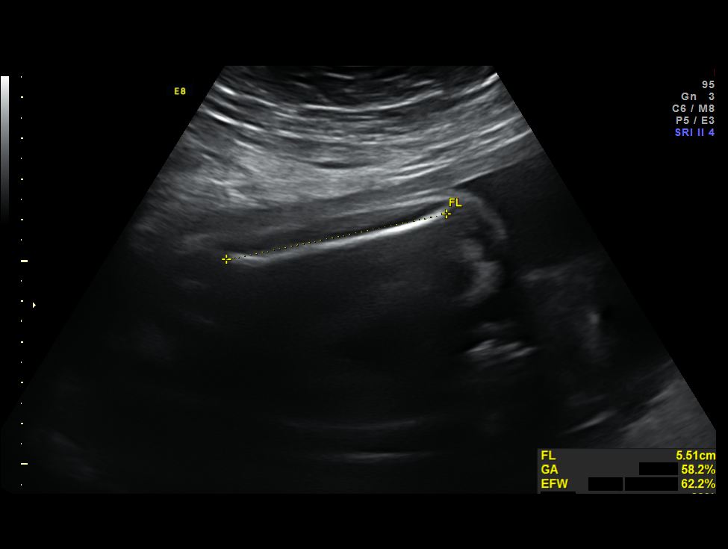
[im 32/51]
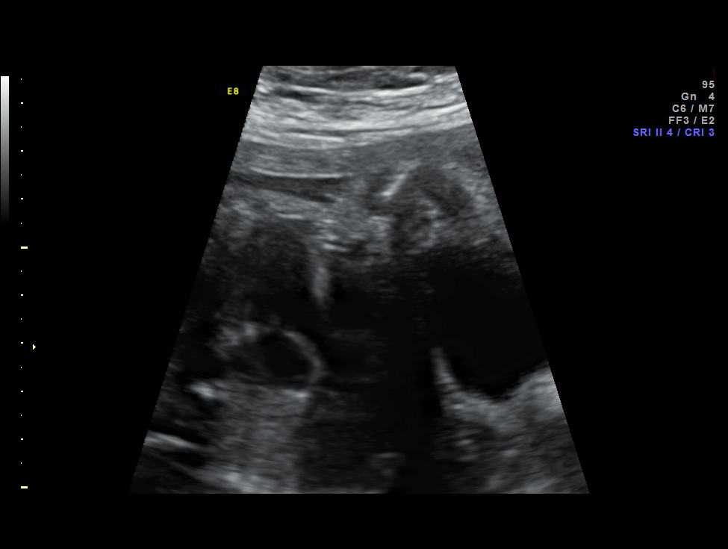
[im 36/51]
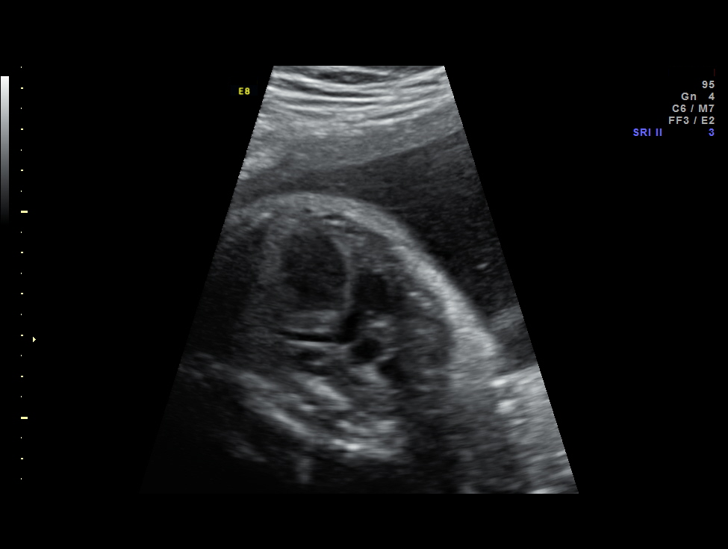
[im 39/51]
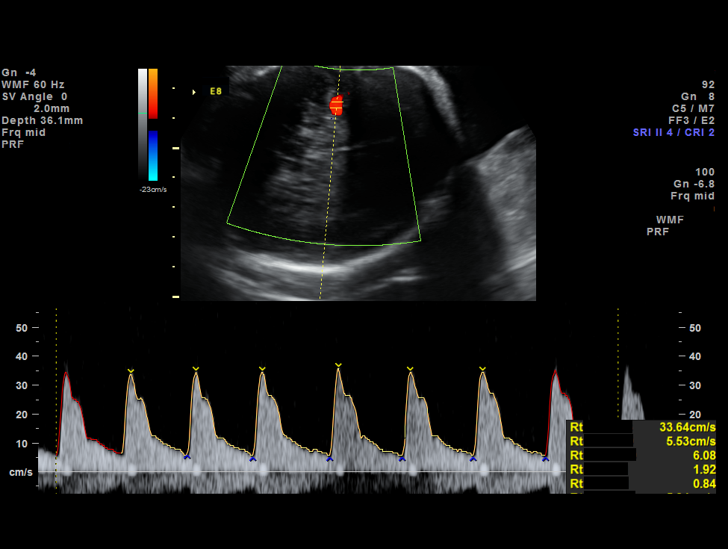
[im 43/51]
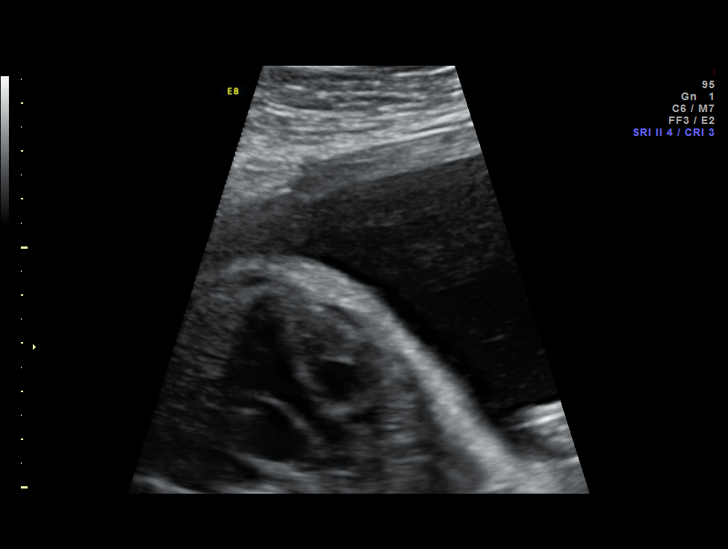
[im 47/51]
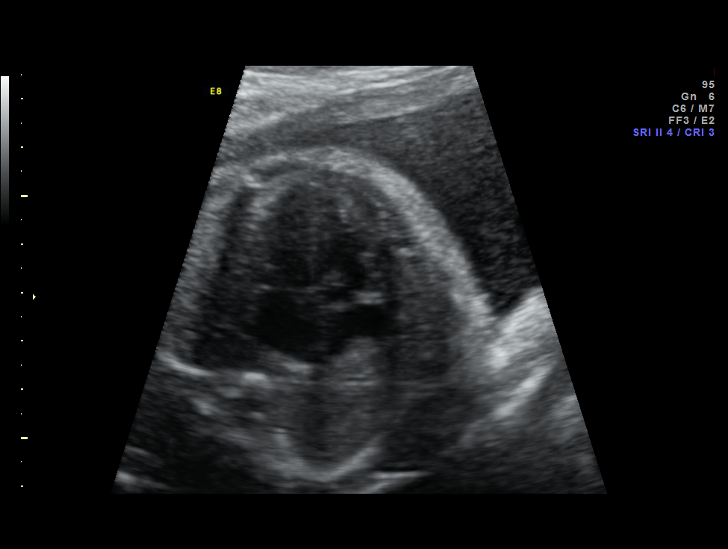
[im 51/51]
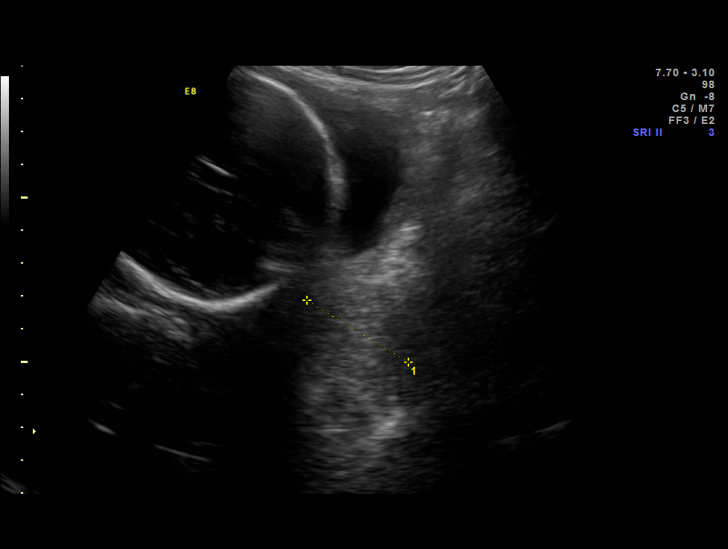

[14 of 28 positions shown; findings below may reference images not displayed]

Canned report from images found in remote index.

Refer to host system for actual result text.

## 2013-01-20 ENCOUNTER — Emergency Department (HOSPITAL_BASED_OUTPATIENT_CLINIC_OR_DEPARTMENT_OTHER)
Admission: EM | Admit: 2013-01-20 | Discharge: 2013-01-20 | Disposition: A | Discharge status: Home / Self Care | Payer: Self-pay | Attending: Emergency Medicine | Admitting: Emergency Medicine

## 2013-01-20 ENCOUNTER — Encounter (HOSPITAL_BASED_OUTPATIENT_CLINIC_OR_DEPARTMENT_OTHER): Payer: Self-pay

## 2013-01-20 DIAGNOSIS — N739 Female pelvic inflammatory disease, unspecified: Secondary | ICD-10-CM | POA: Insufficient documentation

## 2013-01-20 DIAGNOSIS — Z8619 Personal history of other infectious and parasitic diseases: Secondary | ICD-10-CM | POA: Insufficient documentation

## 2013-01-20 DIAGNOSIS — Z862 Personal history of diseases of the blood and blood-forming organs and certain disorders involving the immune mechanism: Secondary | ICD-10-CM | POA: Insufficient documentation

## 2013-01-20 DIAGNOSIS — Z8632 Personal history of gestational diabetes: Secondary | ICD-10-CM | POA: Insufficient documentation

## 2013-01-20 DIAGNOSIS — R110 Nausea: Secondary | ICD-10-CM | POA: Insufficient documentation

## 2013-01-20 DIAGNOSIS — Z3202 Encounter for pregnancy test, result negative: Secondary | ICD-10-CM | POA: Insufficient documentation

## 2013-01-20 DIAGNOSIS — N730 Acute parametritis and pelvic cellulitis: Secondary | ICD-10-CM

## 2013-01-20 DIAGNOSIS — E669 Obesity, unspecified: Secondary | ICD-10-CM | POA: Insufficient documentation

## 2013-01-20 DIAGNOSIS — A599 Trichomoniasis, unspecified: Secondary | ICD-10-CM | POA: Insufficient documentation

## 2013-01-20 LAB — URINALYSIS, ROUTINE W REFLEX MICROSCOPIC
Glucose, UA: NEGATIVE mg/dL
Ketones, ur: NEGATIVE mg/dL
Specific Gravity, Urine: 1.036 — ABNORMAL HIGH (ref 1.005–1.030)
pH: 6.5 (ref 5.0–8.0)

## 2013-01-20 LAB — WET PREP, GENITAL: Yeast Wet Prep HPF POC: NONE SEEN

## 2013-01-20 LAB — URINE MICROSCOPIC-ADD ON: RBC / HPF: NONE SEEN RBC/hpf (ref <3)

## 2013-01-20 MED ORDER — LIDOCAINE HCL (PF) 1 % IJ SOLN
INTRAMUSCULAR | Status: AC
  Administered 2013-01-20: 1.2 mL
  Filled 2013-01-20: qty 5

## 2013-01-20 MED ORDER — CEFTRIAXONE SODIUM 250 MG IJ SOLR
250.0000 mg | Freq: Once | INTRAMUSCULAR | Status: AC
  Administered 2013-01-20: 250 mg via INTRAMUSCULAR
  Filled 2013-01-20: qty 250

## 2013-01-20 MED ORDER — ONDANSETRON 8 MG PO TBDP: 8.0000 mg | ORAL_TABLET | Freq: Three times a day (TID) | ORAL | Status: DC | PRN

## 2013-01-20 MED ORDER — METRONIDAZOLE 500 MG PO TABS
2000.0000 mg | ORAL_TABLET | Freq: Once | ORAL | Status: AC
  Administered 2013-01-20: 2000 mg via ORAL
  Filled 2013-01-20: qty 4

## 2013-01-20 MED ORDER — KETOROLAC TROMETHAMINE 60 MG/2ML IM SOLN
60.0000 mg | Freq: Once | INTRAMUSCULAR | Status: AC
  Administered 2013-01-20: 60 mg via INTRAMUSCULAR
  Filled 2013-01-20: qty 2

## 2013-01-20 MED ORDER — OXYCODONE-ACETAMINOPHEN 5-325 MG PO TABS: 1.0000 | ORAL_TABLET | ORAL | Status: DC | PRN

## 2013-01-20 MED ORDER — AZITHROMYCIN 250 MG PO TABS
1000.0000 mg | ORAL_TABLET | Freq: Once | ORAL | Status: AC
  Administered 2013-01-20: 1000 mg via ORAL
  Filled 2013-01-20: qty 4

## 2013-01-20 MED ORDER — DOXYCYCLINE HYCLATE 100 MG PO CAPS: 100.0000 mg | ORAL_CAPSULE | Freq: Two times a day (BID) | ORAL | Status: DC

## 2013-01-20 NOTE — ED Notes (Signed)
Pt reports abdominal cramping x 2 days unrelieved after taking OTC medications.

## 2013-01-20 NOTE — ED Notes (Signed)
MD at bedside. 

## 2013-01-20 NOTE — ED Provider Notes (Addendum)
History     CSN: [id number]  Arrival date & time 01/20/13  0730   First MD Initiated Contact with Patient 01/20/13 [phone]      Chief Complaint  Patient presents with  . Abdominal Cramping    (Consider location/radiation/quality/duration/timing/severity/associated sxs/prior treatment) HPI 27 y.o. Female complaining of lower abdominal cramping for three days.  Describes as cramping increases and decreases without treatment or triggers.  Patient states she took pregnancy test last night and today and was negative.  LMP  2/24.  Cycle ususally is comes on on the 24 or 26th of the month.  States she sometimes has cramps the day before but this has gone on longer.  She thought she might have seen some pink this a.m.  Feels like menstrual cramps.  States took [employer] without relief.  STates is severe.  Remains in lower abdomen.  Nausea, but no vomiting.  No dysuria, or frequency.  Denies vaginal discharge, no dyspareunia.  Implanon removed in February and had cycle 2/24.  Sexual partners x 1 past year. No pmd.  HD for women's health. G2p2.    Past Medical History  Diagnosis Date  . STD (female)     Trich-tx'd in 11/2010,   . Obese   . Gestational diabetes   . Anemia     Past Surgical History  Procedure Laterality Date  . Uterine ateriography  12/21/2005    bilateral  . Dilation and curettage of uterus  2007  . Cesarean section    . Hernia repair    . Cystectomy    . Tonsillectomy    . Adenoidectomy    . Tooth extraction    . Cesarean section  08/10/2011    Procedure: CESAREAN SECTION;  Surgeon: Tereso Newcomer, MD;  Location: [facility];  Service: Gynecology;  Laterality: N/A;    Family History  Problem Relation Age of Onset  . Diabetes Maternal Grandfather   . Hypertension Father     History  Substance Use Topics  . Smoking status: Never Smoker   . Smokeless tobacco: Never Used  . Alcohol Use: Yes     Comment: occasional    OB History   Grav Para Term  Preterm Abortions TAB SAB Ect Mult Living   2 2 2  0 0 0 0 0 0 2      Review of Systems  All other systems reviewed and are negative.    Allergies  Review of patient's allergies indicates no known allergies.  Home Medications   Current Outpatient Rx  Name  Route  Sig  Dispense  Refill  . acetaminophen (TYLENOL) 500 MG tablet   Oral   Take 1,000 mg by mouth every 6 (six) hours as needed. For headache.         . ibuprofen (ADVIL,MOTRIN) 200 MG tablet   Oral   Take 600 mg by mouth every 6 (six) hours as needed. For headache.           BP 128/107  Pulse 83  Temp(Src) 98.2 F (36.8 C) (Oral)  Resp 18  Ht 5\' 7"  (1.702 m)  Wt 335 lb (151.955 kg)  BMI 52.46 kg/m2  SpO2 100%  LMP 12/19/2012  Physical Exam  Nursing note and vitals reviewed. Constitutional: She appears well-developed and well-nourished.  Morbidly obese  HENT:  Head: Normocephalic and atraumatic.  Right Ear: External ear normal.  Left Ear: External ear normal.  Nose: Nose normal.  Mouth/Throat: Oropharynx is clear and moist.  Eyes:  Conjunctivae and EOM are normal. Pupils are equal, round, and reactive to light.  Neck: Normal range of motion.  Cardiovascular: Normal rate, regular rhythm, normal heart sounds and intact distal pulses.   Pulmonary/Chest: Effort normal and breath sounds normal.  Abdominal: Soft. Bowel sounds are normal. She exhibits no distension and no mass. There is no rebound and no guarding.  Mild suprapubic tenderness to palpation  Genitourinary: Pelvic exam was performed with patient supine. There is erythema and tenderness around the vagina. No foreign body around the vagina. Vaginal discharge found.    ED Course  Procedures (including critical care time)  Labs Reviewed  URINALYSIS, ROUTINE W REFLEX MICROSCOPIC - Abnormal; Notable for the following:    Specific Gravity, Urine 1.036 (*)    Leukocytes, UA SMALL (*)    All other components within normal limits  URINE  MICROSCOPIC-ADD ON - Abnormal; Notable for the following:    Squamous Epithelial / LPF FEW (*)    Bacteria, UA FEW (*)    All other components within normal limits  URINE CULTURE  PREGNANCY, URINE   No results found.   No diagnosis found. Results for orders placed during the hospital encounter of 01/20/13  WET PREP, GENITAL      Result Value Range   Yeast Wet Prep HPF POC NONE SEEN  NONE SEEN   Trich, Wet Prep TOO NUMEROUS TO COUNT (*) NONE SEEN   Clue Cells Wet Prep HPF POC MODERATE (*) NONE SEEN   WBC, Wet Prep HPF POC MODERATE (*) NONE SEEN  PREGNANCY, URINE      Result Value Range   Preg Test, Ur NEGATIVE  NEGATIVE  URINALYSIS, ROUTINE W REFLEX MICROSCOPIC      Result Value Range   Color, Urine YELLOW  YELLOW   APPearance CLEAR  CLEAR   Specific Gravity, Urine 1.036 (*) 1.005 - 1.030   pH 6.5  5.0 - 8.0   Glucose, UA NEGATIVE  NEGATIVE mg/dL   Hgb urine dipstick NEGATIVE  NEGATIVE   Bilirubin Urine NEGATIVE  NEGATIVE   Ketones, ur NEGATIVE  NEGATIVE mg/dL   Protein, ur NEGATIVE  NEGATIVE mg/dL   Urobilinogen, UA 1.0  0.0 - 1.0 mg/dL   Nitrite NEGATIVE  NEGATIVE   Leukocytes, UA SMALL (*) NEGATIVE  URINE MICROSCOPIC-ADD ON      Result Value Range   Squamous Epithelial / LPF FEW (*) RARE   WBC, UA 11-20  <3 WBC/hpf   RBC / HPF    <3 RBC/hpf   Value: NO FORMED ELEMENTS SEEN ON URINE MICROSCOPIC EXAMINATION   Bacteria, UA FEW (*) RARE      MDM  Patient presents today with pelvic pain and vaginal discharge on exam.  Pregnancy test is negative. Patient has too numerous to count components on wet prep. She had some discomfort on bimanual exam. She is given Rocephin and Zithromax here. She'll be treated with Flagyl for Trichomonas.  She'll be treated outpatient with doxycycline 100 mg every 12 hours for 14 days  she is given metronidazole 2 g here for treatment of Trichomonas she is advised close followup. She is to return if she is worse anytime.    Hilario Quarry,  MD 01/20/13 1478  Hilario Quarry, MD 01/20/13 [phone]

## 2013-01-21 LAB — GC/CHLAMYDIA PROBE AMP: CT Probe RNA: NEGATIVE

## 2013-01-21 LAB — URINE CULTURE

## 2013-01-22 ENCOUNTER — Telehealth (HOSPITAL_COMMUNITY): Payer: Self-pay | Admitting: Emergency Medicine

## 2013-01-22 NOTE — ED Notes (Signed)
Patient has +Urine culture. Checking to see if appropriately treated. °

## 2013-01-22 NOTE — ED Notes (Signed)
+  Urine. Patient given Doxycycline. No sensitivities listed. Chart sent to EDP office for review.

## 2013-01-25 ENCOUNTER — Telehealth (HOSPITAL_COMMUNITY): Payer: Self-pay | Admitting: Emergency Medicine

## 2013-01-25 NOTE — ED Notes (Signed)
Chart reviewed by H. Anne Shutter PA "Dipheroids normal skin flouro <35,000 colonies.  No Antibiotic indicated."  Chart appended.

## 2013-06-24 ENCOUNTER — Emergency Department (HOSPITAL_BASED_OUTPATIENT_CLINIC_OR_DEPARTMENT_OTHER)
Admission: EM | Admit: 2013-06-24 | Discharge: 2013-06-24 | Disposition: A | Discharge status: Home / Self Care | Payer: Medicaid Other | Attending: Emergency Medicine | Admitting: Emergency Medicine

## 2013-06-24 ENCOUNTER — Encounter (HOSPITAL_BASED_OUTPATIENT_CLINIC_OR_DEPARTMENT_OTHER): Payer: Self-pay

## 2013-06-24 DIAGNOSIS — E669 Obesity, unspecified: Secondary | ICD-10-CM | POA: Insufficient documentation

## 2013-06-24 DIAGNOSIS — R350 Frequency of micturition: Secondary | ICD-10-CM | POA: Insufficient documentation

## 2013-06-24 DIAGNOSIS — A638 Other specified predominantly sexually transmitted diseases: Secondary | ICD-10-CM | POA: Insufficient documentation

## 2013-06-24 DIAGNOSIS — R358 Other polyuria: Secondary | ICD-10-CM | POA: Insufficient documentation

## 2013-06-24 DIAGNOSIS — Z9889 Other specified postprocedural states: Secondary | ICD-10-CM | POA: Insufficient documentation

## 2013-06-24 DIAGNOSIS — Z3202 Encounter for pregnancy test, result negative: Secondary | ICD-10-CM | POA: Insufficient documentation

## 2013-06-24 DIAGNOSIS — M549 Dorsalgia, unspecified: Secondary | ICD-10-CM | POA: Insufficient documentation

## 2013-06-24 DIAGNOSIS — R3589 Other polyuria: Secondary | ICD-10-CM | POA: Insufficient documentation

## 2013-06-24 DIAGNOSIS — Z862 Personal history of diseases of the blood and blood-forming organs and certain disorders involving the immune mechanism: Secondary | ICD-10-CM | POA: Insufficient documentation

## 2013-06-24 DIAGNOSIS — R109 Unspecified abdominal pain: Secondary | ICD-10-CM | POA: Insufficient documentation

## 2013-06-24 DIAGNOSIS — Z792 Long term (current) use of antibiotics: Secondary | ICD-10-CM | POA: Insufficient documentation

## 2013-06-24 DIAGNOSIS — R631 Polydipsia: Secondary | ICD-10-CM | POA: Insufficient documentation

## 2013-06-24 DIAGNOSIS — A599 Trichomoniasis, unspecified: Secondary | ICD-10-CM | POA: Insufficient documentation

## 2013-06-24 LAB — URINALYSIS, ROUTINE W REFLEX MICROSCOPIC
Bilirubin Urine: NEGATIVE
Glucose, UA: NEGATIVE mg/dL
Hgb urine dipstick: NEGATIVE
Ketones, ur: NEGATIVE mg/dL
Protein, ur: NEGATIVE mg/dL
Urobilinogen, UA: 0.2 mg/dL (ref 0.0–1.0)

## 2013-06-24 LAB — CBC WITH DIFFERENTIAL/PLATELET
Basophils Absolute: 0 10*3/uL (ref 0.0–0.1)
Basophils Relative: 0 % (ref 0–1)
Eosinophils Relative: 2 % (ref 0–5)
HCT: 35.3 % — ABNORMAL LOW (ref 36.0–46.0)
Hemoglobin: 12.6 g/dL (ref 12.0–15.0)
MCH: 26.6 pg (ref 26.0–34.0)
MCHC: 35.7 g/dL (ref 30.0–36.0)
MCV: 74.6 fL — ABNORMAL LOW (ref 78.0–100.0)
Monocytes Absolute: 0.6 10*3/uL (ref 0.1–1.0)
Monocytes Relative: 13 % — ABNORMAL HIGH (ref 3–12)
RDW: 15 % (ref 11.5–15.5)

## 2013-06-24 LAB — COMPREHENSIVE METABOLIC PANEL
Albumin: 3.5 g/dL (ref 3.5–5.2)
Alkaline Phosphatase: 109 U/L (ref 39–117)
BUN: 12 mg/dL (ref 6–23)
Creatinine, Ser: 0.7 mg/dL (ref 0.50–1.10)
GFR calc Af Amer: >90 mL/min (ref >90)
Glucose, Bld: 85 mg/dL (ref 70–99)
Potassium: 3.5 mEq/L (ref 3.5–5.1)
Total Bilirubin: 0.5 mg/dL (ref 0.3–1.2)
Total Protein: 7.7 g/dL (ref 6.0–8.3)

## 2013-06-24 LAB — WET PREP, GENITAL: Yeast Wet Prep HPF POC: NONE SEEN

## 2013-06-24 LAB — URINE MICROSCOPIC-ADD ON

## 2013-06-24 MED ORDER — ONDANSETRON 4 MG PO TBDP
4.0000 mg | ORAL_TABLET | Freq: Once | ORAL | Status: AC
  Administered 2013-06-24: 4 mg via ORAL
  Filled 2013-06-24: qty 1

## 2013-06-24 MED ORDER — CEFTRIAXONE SODIUM 250 MG IJ SOLR
250.0000 mg | Freq: Once | INTRAMUSCULAR | Status: AC
  Administered 2013-06-24: 250 mg via INTRAMUSCULAR
  Filled 2013-06-24: qty 250

## 2013-06-24 MED ORDER — AZITHROMYCIN 250 MG PO TABS
1000.0000 mg | ORAL_TABLET | Freq: Once | ORAL | Status: AC
  Administered 2013-06-24: 1000 mg via ORAL
  Filled 2013-06-24: qty 4

## 2013-06-24 MED ORDER — KETOROLAC TROMETHAMINE 60 MG/2ML IM SOLN
60.0000 mg | Freq: Once | INTRAMUSCULAR | Status: AC
  Administered 2013-06-24: 60 mg via INTRAMUSCULAR
  Filled 2013-06-24: qty 2

## 2013-06-24 MED ORDER — METRONIDAZOLE 500 MG PO TABS
2000.0000 mg | ORAL_TABLET | Freq: Once | ORAL | Status: AC
  Administered 2013-06-24: 2000 mg via ORAL
  Filled 2013-06-24: qty 4

## 2013-06-24 NOTE — ED Provider Notes (Signed)
CSN: [id number]     Arrival date & time 06/24/13  1000 History   None    Chief Complaint  Patient presents with  . Abdominal Pain  . Polyuria   (Consider location/radiation/quality/duration/timing/severity/associated sxs/prior Treatment) HPI This is a 27 year old female who presents with abdominal pain and polyuria. Patient reports 3 weeks of increased urinary frequency and at times incontinence. She reports that sometimes she just can't make it to the bathroom and time. She is set up to see a primary care physician in one week for the symptoms. She states last night she began to have onset of back pain and generalized lower tunnel pain. She describes as crampy. It is nonradiating. The back pain is on both sides. She currently her pain at 6/10. She has noticed a strong smell to her urine and a redness discoloration to it. She denies any dysuria. She just finished her menstrual cycle. She has a history of gestational diabetes. patient denies any vaginal discharge, vaginal bleeding, or new sexual partners.  She denies any fever, headache, chest pain, shortness of breath, focal weakness or numbness. Past Medical History  Diagnosis Date  . STD (female)     Trich-tx'd in 11/2010,   . Obese   . Anemia    Past Surgical History  Procedure Laterality Date  . Uterine ateriography  12/21/2005    bilateral  . Dilation and curettage of uterus  2007  . Cesarean section    . Hernia repair    . Cystectomy    . Tonsillectomy    . Adenoidectomy    . Tooth extraction    . Cesarean section  08/10/2011    Procedure: CESAREAN SECTION;  Surgeon: Tereso Newcomer, MD;  Location: [facility];  Service: Gynecology;  Laterality: N/A;   Family History  Problem Relation Age of Onset  . Diabetes Maternal Grandfather   . Hypertension Father    History  Substance Use Topics  . Smoking status: Never Smoker   . Smokeless tobacco: Never Used  . Alcohol Use: No     Comment: occasional   OB History   Grav Para Term  Preterm Abortions TAB SAB Ect Mult Living   2 2 2  0 0 0 0 0 0 2     Review of Systems  Constitutional: Negative for fever.  Respiratory: Negative for cough, chest tightness and shortness of breath.   Cardiovascular: Negative for chest pain.  Gastrointestinal: Positive for abdominal pain. Negative for nausea, vomiting and diarrhea.  Endocrine: Positive for polydipsia and polyuria.  Genitourinary: Positive for frequency. Negative for dysuria.  Musculoskeletal: Positive for back pain.  Skin: Negative for wound.  Neurological: Negative for dizziness and headaches.  Psychiatric/Behavioral: Negative for confusion.  All other systems reviewed and are negative.    Allergies  Review of patient's allergies indicates no known allergies.  Home Medications   Current Outpatient Rx  Name  Route  Sig  Dispense  Refill  . acetaminophen (TYLENOL) 500 MG tablet   Oral   Take 1,000 mg by mouth every 6 (six) hours as needed. For headache.         . doxycycline (VIBRAMYCIN) 100 MG capsule   Oral   Take 1 capsule (100 mg total) by mouth 2 (two) times daily.   28 capsule   0   . ibuprofen (ADVIL,MOTRIN) 200 MG tablet   Oral   Take 600 mg by mouth every 6 (six) hours as needed. For headache.         Marland Kitchen  ondansetron (ZOFRAN ODT) 8 MG disintegrating tablet   Oral   Take 1 tablet (8 mg total) by mouth every 8 (eight) hours as needed for nausea.   20 tablet   0   . oxyCODONE-acetaminophen (PERCOCET/ROXICET) 5-325 MG per tablet   Oral   Take 1 tablet by mouth every 4 (four) hours as needed for pain.   6 tablet   0    BP 125/83  Pulse 79  Temp(Src) 98.2 F (36.8 C) (Oral)  Resp 18  Ht 5' 7.5" (1.715 m)  Wt 340 lb (154.223 kg)  BMI 52.43 kg/m2  SpO2 100%  LMP 06/17/2013 Physical Exam  Nursing note and vitals reviewed. Constitutional: She is oriented to person, place, and time. She appears well-developed and well-nourished. No distress.  obese  HENT:  Head: Normocephalic and  atraumatic.  Eyes: Pupils are equal, round, and reactive to light.  Neck: Neck supple.  Cardiovascular: Normal rate, regular rhythm and normal heart sounds.   Pulmonary/Chest: Effort normal and breath sounds normal. No respiratory distress.  Abdominal: Soft. Bowel sounds are normal. There is no tenderness. There is no rebound and no guarding.  Genitourinary:  Normal vaginal wall. Copious white frothy vaginal discharge. No cervical motion tenderness. No adnexal tenderness.  Musculoskeletal: She exhibits no edema.  Neurological: She is alert and oriented to person, place, and time.  Skin: Skin is warm and dry.  Psychiatric: She has a normal mood and affect.    ED Course  Procedures (including critical care time) Labs Review Labs Reviewed  WET PREP, GENITAL - Abnormal; Notable for the following:    Trich, Wet Prep MANY (*)    Clue Cells Wet Prep HPF POC TOO NUMEROUS TO COUNT (*)    WBC, Wet Prep HPF POC MANY (*)    All other components within normal limits  URINALYSIS, ROUTINE W REFLEX MICROSCOPIC - Abnormal; Notable for the following:    Leukocytes, UA SMALL (*)    All other components within normal limits  CBC WITH DIFFERENTIAL - Abnormal; Notable for the following:    HCT 35.3 (*)    MCV 74.6 (*)    Neutrophils Relative % 40 (*)    Monocytes Relative 13 (*)    All other components within normal limits  URINE MICROSCOPIC-ADD ON - Abnormal; Notable for the following:    Bacteria, UA FEW (*)    All other components within normal limits  URINE CULTURE  GC/CHLAMYDIA PROBE AMP  PREGNANCY, URINE  COMPREHENSIVE METABOLIC PANEL   Imaging Review No results found.  MDM   1. Abdominal pain   2. Trichimoniasis    Patient presents with abdominal pain and polyuria. She is nontoxic-appearing on exam. Her abdominal exam is benign. Patient was found to have Trichomonas in her urine. Pelvic exam was performed. She had no adnexal tenderness or cervical motion tenderness. She was treated  and tested for GC Chlamydia as well as Trichomonas. Rest of the patient's workup was unremarkable.  After history, exam, and medical workup I feel the patient has been appropriately medically screened and is safe for discharge home. Pertinent diagnoses were discussed with the patient. Patient was given return precautions.   Shon Baton, MD 06/24/13 [phone]

## 2013-06-24 NOTE — ED Notes (Signed)
Pelvic cart is at the bedside. The grandmother is on her way to pick up the patient's daughter.

## 2013-06-24 NOTE — ED Notes (Signed)
Symptoms x 4 days with lower abdominal pain (7/10), pressure radiates to back, with frequent urination as well, no dysuria, has odor, color slightly pink, ABC intact. Thoughts coherent. Hx of UTI

## 2013-06-25 LAB — URINE CULTURE

## 2013-09-13 ENCOUNTER — Emergency Department (HOSPITAL_BASED_OUTPATIENT_CLINIC_OR_DEPARTMENT_OTHER)
Admission: EM | Admit: 2013-09-13 | Discharge: 2013-09-13 | Disposition: A | Discharge status: Home / Self Care | Payer: Medicaid Other | Attending: Emergency Medicine | Admitting: Emergency Medicine

## 2013-09-13 ENCOUNTER — Encounter (HOSPITAL_BASED_OUTPATIENT_CLINIC_OR_DEPARTMENT_OTHER): Payer: Self-pay | Admitting: Emergency Medicine

## 2013-09-13 DIAGNOSIS — M79609 Pain in unspecified limb: Secondary | ICD-10-CM | POA: Insufficient documentation

## 2013-09-13 DIAGNOSIS — M79672 Pain in left foot: Secondary | ICD-10-CM

## 2013-09-13 DIAGNOSIS — Z792 Long term (current) use of antibiotics: Secondary | ICD-10-CM | POA: Insufficient documentation

## 2013-09-13 DIAGNOSIS — Z862 Personal history of diseases of the blood and blood-forming organs and certain disorders involving the immune mechanism: Secondary | ICD-10-CM | POA: Insufficient documentation

## 2013-09-13 DIAGNOSIS — E669 Obesity, unspecified: Secondary | ICD-10-CM | POA: Insufficient documentation

## 2013-09-13 DIAGNOSIS — Z8619 Personal history of other infectious and parasitic diseases: Secondary | ICD-10-CM | POA: Insufficient documentation

## 2013-09-13 MED ORDER — HYDROCODONE-ACETAMINOPHEN 5-325 MG PO TABS: 1.0000 | ORAL_TABLET | Freq: Four times a day (QID) | ORAL | Status: DC | PRN

## 2013-09-13 NOTE — ED Provider Notes (Signed)
CSN: [id number]     Arrival date & time 09/13/13  1121 History   First MD Initiated Contact with Patient 09/13/13 1126     Chief Complaint  Patient presents with  . Foot Pain    HPI  Patient presents with concern of ongoing heel pain.  Pain has been present for greater than one year.  Patient has been seen by both podiatry and surgery, and is currently awaiting her next visit. She notes that the pain is worse with ambulating, weightbearing, and at the end of the day. Minimal relief w OTC meds. No new f/c, n/v, superficial changes. Patient is obese, preparing for gastric sleeve procedure as well.   Past Medical History  Diagnosis Date  . STD (female)     Trich-tx'd in 11/2010,   . Obese   . Anemia    Past Surgical History  Procedure Laterality Date  . Uterine ateriography  12/21/2005    bilateral  . Dilation and curettage of uterus  2007  . Cesarean section    . Hernia repair    . Cystectomy    . Tonsillectomy    . Adenoidectomy    . Tooth extraction    . Cesarean section  08/10/2011    Procedure: CESAREAN SECTION;  Surgeon: Tereso Newcomer, MD;  Location: [facility];  Service: Gynecology;  Laterality: N/A;   Family History  Problem Relation Age of Onset  . Diabetes Maternal Grandfather   . Hypertension Father    History  Substance Use Topics  . Smoking status: Never Smoker   . Smokeless tobacco: Never Used  . Alcohol Use: No     Comment: occasional   OB History   Grav Para Term Preterm Abortions TAB SAB Ect Mult Living   2 2 2  0 0 0 0 0 0 2     Review of Systems  All other systems reviewed and are negative.    Allergies  Review of patient's allergies indicates no known allergies.  Home Medications   Current Outpatient Rx  Name  Route  Sig  Dispense  Refill  . acetaminophen (TYLENOL) 500 MG tablet   Oral   Take 1,000 mg by mouth every 6 (six) hours as needed. For headache.         . doxycycline (VIBRAMYCIN) 100 MG capsule   Oral   Take 1 capsule  (100 mg total) by mouth 2 (two) times daily.   28 capsule   0   . HYDROcodone-acetaminophen (NORCO/VICODIN) 5-325 MG per tablet   Oral   Take 1 tablet by mouth every 6 (six) hours as needed for severe pain.   15 tablet   0   . ibuprofen (ADVIL,MOTRIN) 200 MG tablet   Oral   Take 600 mg by mouth every 6 (six) hours as needed. For headache.         . ondansetron (ZOFRAN ODT) 8 MG disintegrating tablet   Oral   Take 1 tablet (8 mg total) by mouth every 8 (eight) hours as needed for nausea.   20 tablet   0   . oxyCODONE-acetaminophen (PERCOCET/ROXICET) 5-325 MG per tablet   Oral   Take 1 tablet by mouth every 4 (four) hours as needed for pain.   6 tablet   0    BP 127/81  Pulse 94  Temp(Src) 98.1 F (36.7 C) (Oral)  Resp 22  Ht 5\' 7"  (1.702 m)  Wt 350 lb (158.759 kg)  BMI 54.80 kg/m2  SpO2 99%  LMP 09/12/2013 Physical Exam  Nursing note and vitals reviewed. Constitutional: She is oriented to person, place, and time. She appears well-developed and well-nourished. No distress.  Large young F in NAD  HENT:  Head: Normocephalic and atraumatic.  Eyes: Conjunctivae and EOM are normal.  Cardiovascular: Normal rate, regular rhythm and intact distal pulses.   Pulmonary/Chest: No stridor. No respiratory distress.  Musculoskeletal: She exhibits no edema.       Feet:  Beyond the heel TTP, the ankle, foot and knee exams are unremarkable.  Neurological: She is alert and oriented to person, place, and time. No cranial nerve deficit.  Skin: Skin is warm and dry. She is not diaphoretic.  Psychiatric: She has a normal mood and affect.    ED Course  Procedures (including critical care time) Labs Review Labs Reviewed - No data to display Imaging Review No results found.  EKG Interpretation   None       MDM   1. Heel pain, left    This large young female presents with ongoing heel pain.  On exam she is awake and alert, neurovascularly intact.  With the ongoing pain,  there is suspicion for bursitis versus plantar fasciitis route there is no evidence of distress, nor systemic pathology.  Patient is appropriate for discharge with outpatient management.    Gerhard Munch, MD 09/13/13 [phone]

## 2013-09-13 NOTE — ED Notes (Signed)
Pt c/o heel pain and is currently being treated by Podiatry and PT. Pt sts she needs "narcotic pain med".

## 2013-11-16 ENCOUNTER — Encounter (HOSPITAL_BASED_OUTPATIENT_CLINIC_OR_DEPARTMENT_OTHER): Payer: Self-pay | Admitting: Emergency Medicine

## 2013-11-16 ENCOUNTER — Emergency Department (HOSPITAL_BASED_OUTPATIENT_CLINIC_OR_DEPARTMENT_OTHER): Payer: Medicaid Other

## 2013-11-16 ENCOUNTER — Emergency Department (HOSPITAL_BASED_OUTPATIENT_CLINIC_OR_DEPARTMENT_OTHER)
Admission: EM | Admit: 2013-11-16 | Discharge: 2013-11-16 | Disposition: A | Discharge status: Home / Self Care | Payer: Medicaid Other | Attending: Emergency Medicine | Admitting: Emergency Medicine

## 2013-11-16 DIAGNOSIS — Z862 Personal history of diseases of the blood and blood-forming organs and certain disorders involving the immune mechanism: Secondary | ICD-10-CM | POA: Insufficient documentation

## 2013-11-16 DIAGNOSIS — J069 Acute upper respiratory infection, unspecified: Secondary | ICD-10-CM | POA: Insufficient documentation

## 2013-11-16 DIAGNOSIS — Z8619 Personal history of other infectious and parasitic diseases: Secondary | ICD-10-CM | POA: Insufficient documentation

## 2013-11-16 DIAGNOSIS — Z792 Long term (current) use of antibiotics: Secondary | ICD-10-CM | POA: Insufficient documentation

## 2013-11-16 DIAGNOSIS — E669 Obesity, unspecified: Secondary | ICD-10-CM | POA: Insufficient documentation

## 2013-11-16 MED ORDER — DIPHENHYDRAMINE HCL 25 MG PO TABS: 25.0000 mg | ORAL_TABLET | Freq: Four times a day (QID) | ORAL | Status: DC | PRN

## 2013-11-16 MED ORDER — HYDROCODONE-HOMATROPINE 5-1.5 MG/5ML PO SYRP: 5.0000 mL | ORAL_SOLUTION | Freq: Four times a day (QID) | ORAL | Status: DC | PRN

## 2013-11-16 NOTE — ED Provider Notes (Signed)
CSN: [id number]     Arrival date & time 11/16/13  1006 History   First MD Initiated Contact with Patient 11/16/13 1207     Chief Complaint  Patient presents with  . Cough  . Nasal Congestion   (Consider location/radiation/quality/duration/timing/severity/associated sxs/prior Treatment) Patient is a 28 y.o. female presenting with cough. The history is provided by the patient. No language interpreter was used.  Cough Cough characteristics:  Productive Sputum characteristics:  Green Severity:  Moderate Onset quality:  Gradual Duration:  4 days Timing:  Constant Progression:  Unchanged Chronicity:  New Smoker: no   Context: upper respiratory infection   Context: not animal exposure, not exposure to allergens, not sick contacts and not smoke exposure   Relieved by:  Nothing Worsened by:  Nothing tried Ineffective treatments:  Cough suppressants Associated symptoms: fever   Associated symptoms: no chest pain, no chills and no shortness of breath   Fever:    Duration:  4 days   Timing:  Intermittent   Temp source:  Subjective   Progression:  Unchanged Risk factors: no chemical exposure, no recent infection and no recent travel     Past Medical History  Diagnosis Date  . STD (female)     Trich-tx'd in 11/2010,   . Obese   . Anemia    Past Surgical History  Procedure Laterality Date  . Uterine ateriography  12/21/2005    bilateral  . Dilation and curettage of uterus  2007  . Cesarean section    . Hernia repair    . Cystectomy    . Tonsillectomy    . Adenoidectomy    . Tooth extraction    . Cesarean section  08/10/2011    Procedure: CESAREAN SECTION;  Surgeon: Tereso NewcomerUgonna A Anyanwu, MD;  Location: [facility];  Service: Gynecology;  Laterality: N/A;   Family History  Problem Relation Age of Onset  . Diabetes Maternal Grandfather   . Hypertension Father    History  Substance Use Topics  . Smoking status: Never Smoker   . Smokeless tobacco: Never Used  . Alcohol Use: No   Comment: occasional   OB History   Grav Para Term Preterm Abortions TAB SAB Ect Mult Living   2 2 2  0 0 0 0 0 0 2     Review of Systems  Constitutional: Positive for fever. Negative for chills and fatigue.  HENT: Positive for congestion. Negative for trouble swallowing.   Eyes: Negative for visual disturbance.  Respiratory: Positive for cough. Negative for shortness of breath.   Cardiovascular: Negative for chest pain and palpitations.  Gastrointestinal: Negative for nausea, vomiting, abdominal pain and diarrhea.  Genitourinary: Negative for dysuria and difficulty urinating.  Musculoskeletal: Negative for arthralgias and neck pain.  Skin: Negative for color change.  Neurological: Negative for dizziness and weakness.  Psychiatric/Behavioral: Negative for dysphoric mood.    Allergies  Review of patient's allergies indicates no known allergies.  Home Medications   Current Outpatient Rx  Name  Route  Sig  Dispense  Refill  . acetaminophen (TYLENOL) 500 MG tablet   Oral   Take 1,000 mg by mouth every 6 (six) hours as needed. For headache.         . doxycycline (VIBRAMYCIN) 100 MG capsule   Oral   Take 1 capsule (100 mg total) by mouth 2 (two) times daily.   28 capsule   0   . HYDROcodone-acetaminophen (NORCO/VICODIN) 5-325 MG per tablet   Oral   Take 1 tablet by  mouth every 6 (six) hours as needed for severe pain.   15 tablet   0   . ibuprofen (ADVIL,MOTRIN) 200 MG tablet   Oral   Take 600 mg by mouth every 6 (six) hours as needed. For headache.         . ondansetron (ZOFRAN ODT) 8 MG disintegrating tablet   Oral   Take 1 tablet (8 mg total) by mouth every 8 (eight) hours as needed for nausea.   20 tablet   0   . oxyCODONE-acetaminophen (PERCOCET/ROXICET) 5-325 MG per tablet   Oral   Take 1 tablet by mouth every 4 (four) hours as needed for pain.   6 tablet   0    BP 139/89  Pulse 83  Temp(Src) 99 F (37.2 C) (Oral)  Resp 18  Ht 5\' 7"  (1.702 m)  Wt  355 lb (161.027 kg)  BMI 55.59 kg/m2  SpO2 100%  LMP 11/09/2013 Physical Exam  Nursing note and vitals reviewed. Constitutional: She is oriented to person, place, and time. She appears well-developed and well-nourished. No distress.  HENT:  Head: Normocephalic and atraumatic.  Mouth/Throat: Oropharynx is clear and moist. No oropharyngeal exudate.  Eyes: Conjunctivae and EOM are normal. Pupils are equal, round, and reactive to light.  Neck: Normal range of motion.  Cardiovascular: Normal rate and regular rhythm.  Exam reveals no gallop and no friction rub.   No murmur heard. Pulmonary/Chest: Effort normal. She has no wheezes. She has no rales. She exhibits no tenderness.  Rhonchi noted at bilateral lung bases.   Abdominal: Soft. She exhibits no distension. There is no tenderness. There is no rebound.  Musculoskeletal: Normal range of motion.  Neurological: She is alert and oriented to person, place, and time. Coordination normal.  Speech is goal-oriented. Moves limbs without ataxia.   Skin: Skin is warm and dry.  Psychiatric: She has a normal mood and affect. Her behavior is normal.    ED Course  Procedures (including critical care time) Labs Review Labs Reviewed - No data to display Imaging Review Dg Chest 2 View  11/16/2013   CLINICAL DATA:  Cough, congestion  EXAM: CHEST  2 VIEW  COMPARISON:  10/31/2013  FINDINGS: Cardiomediastinal silhouette is stable. No acute infiltrate or pleural effusion. No pulmonary edema. Mild perihilar increased bronchial markings without focal consolidation. Bony thorax is stable.  IMPRESSION: No acute infiltrate or pulmonary edema. Mild perihilar increased bronchial markings without focal consolidation.   Electronically Signed   By: Natasha Mead M.D.   On: 11/16/2013 12:26    EKG Interpretation   None       MDM   1. URI (upper respiratory infection)     12:31 PM Chest xray unremarkable for pneumonia. Patient likely has URI. I will discharge her  with hycodan and benadryl for symptoms. Vitals stable and patient afebrile. Patient advised to return with worsening or concerning symptoms.     Emilia Beck, PA-C 11/16/13 1237

## 2013-11-16 NOTE — ED Provider Notes (Signed)
Medical screening examination/treatment/procedure(s) were performed by non-physician practitioner and as supervising physician I was immediately available for consultation/collaboration.   Perri Lamagna L Brennen Gardiner, MD 11/16/13 1953 

## 2013-11-16 NOTE — Discharge Instructions (Signed)
Take Hycodan as needed for cough. Take benadryl as needed for congestion. Refer to attached documents for more information. Return to the ED with worsening or concerning symptoms.

## 2013-11-16 NOTE — ED Notes (Signed)
Pt amb to room 9 with quick steady gait in nad. Pt eating fast food hamburgers and fries during triage, reports cough, congestion x 4 days ago. Subjective temps.

## 2014-08-27 ENCOUNTER — Encounter (HOSPITAL_BASED_OUTPATIENT_CLINIC_OR_DEPARTMENT_OTHER): Payer: Self-pay | Admitting: Emergency Medicine

## 2014-09-23 ENCOUNTER — Emergency Department (HOSPITAL_BASED_OUTPATIENT_CLINIC_OR_DEPARTMENT_OTHER)
Admission: EM | Admit: 2014-09-23 | Discharge: 2014-09-23 | Disposition: A | Discharge status: Home / Self Care | Payer: Medicaid Other | Attending: Emergency Medicine | Admitting: Emergency Medicine

## 2014-09-23 ENCOUNTER — Encounter (HOSPITAL_BASED_OUTPATIENT_CLINIC_OR_DEPARTMENT_OTHER): Payer: Self-pay | Admitting: *Deleted

## 2014-09-23 DIAGNOSIS — Z9889 Other specified postprocedural states: Secondary | ICD-10-CM | POA: Diagnosis not present

## 2014-09-23 DIAGNOSIS — Z792 Long term (current) use of antibiotics: Secondary | ICD-10-CM | POA: Diagnosis not present

## 2014-09-23 DIAGNOSIS — Z3202 Encounter for pregnancy test, result negative: Secondary | ICD-10-CM | POA: Insufficient documentation

## 2014-09-23 DIAGNOSIS — Z862 Personal history of diseases of the blood and blood-forming organs and certain disorders involving the immune mechanism: Secondary | ICD-10-CM | POA: Insufficient documentation

## 2014-09-23 DIAGNOSIS — B3731 Acute candidiasis of vulva and vagina: Secondary | ICD-10-CM

## 2014-09-23 DIAGNOSIS — Z90710 Acquired absence of both cervix and uterus: Secondary | ICD-10-CM | POA: Diagnosis not present

## 2014-09-23 DIAGNOSIS — N898 Other specified noninflammatory disorders of vagina: Secondary | ICD-10-CM | POA: Diagnosis present

## 2014-09-23 DIAGNOSIS — B373 Candidiasis of vulva and vagina: Secondary | ICD-10-CM

## 2014-09-23 LAB — WET PREP, GENITAL: TRICH WET PREP: NONE SEEN

## 2014-09-23 LAB — HIV ANTIBODY (ROUTINE TESTING W REFLEX): HIV 1&2 Ab, 4th Generation: NONREACTIVE

## 2014-09-23 LAB — PREGNANCY, URINE: Preg Test, Ur: NEGATIVE

## 2014-09-23 MED ORDER — FLUCONAZOLE 150 MG PO TABS
150.0000 mg | ORAL_TABLET | Freq: Once | ORAL | Status: DC
Start: 2014-09-26 — End: 2016-02-25

## 2014-09-23 MED ORDER — FLUCONAZOLE 50 MG PO TABS
150.0000 mg | ORAL_TABLET | Freq: Once | ORAL | Status: AC
  Administered 2014-09-23: 150 mg via ORAL
  Filled 2014-09-23 (×2): qty 1

## 2014-09-23 NOTE — ED Notes (Signed)
Patient thinks she may have a yeast infection. Used an OTC medicine, without relief.

## 2014-09-23 NOTE — Discharge Instructions (Signed)

## 2014-09-23 NOTE — ED Provider Notes (Signed)
CSN: [id number]     Arrival date & time 09/23/14  [id number] History   First MD Initiated Contact with Patient 09/23/14 [phone]     Chief Complaint  Patient presents with  . Vaginal Discharge     (Consider location/radiation/quality/duration/timing/severity/associated sxs/prior Treatment) HPI  28 year old female presents with white vaginal discharge and vaginal itching externally over the past 3-4 days. This feels like prior yeast infections. She has had a prior history of Trichomonas that she states was asymptomatic. The discharge has a smell but is not foul-smelling. There's been no vaginal bleeding. There is no pain associated with this including no abdominal pain and no burning or pain with urination. No hematuria. The patient has not had any fevers or chills. She's been trying Monistat with partial relief but still has symptoms. She has unprotected sex with her boyfriend.  Past Medical History  Diagnosis Date  . STD (female)     Trich-tx'd in 11/2010,   . Obese   . Anemia    Past Surgical History  Procedure Laterality Date  . Uterine ateriography  12/21/2005    bilateral  . Dilation and curettage of uterus  2007  . Cesarean section    . Hernia repair    . Cystectomy    . Tonsillectomy    . Adenoidectomy    . Tooth extraction    . Cesarean section  08/10/2011    Procedure: CESAREAN SECTION;  Surgeon: Tereso NewcomerUgonna A Anyanwu, MD;  Location: [facility];  Service: Gynecology;  Laterality: N/A;   Family History  Problem Relation Age of Onset  . Diabetes Maternal Grandfather   . Hypertension Father    History  Substance Use Topics  . Smoking status: Never Smoker   . Smokeless tobacco: Never Used  . Alcohol Use: No     Comment: occasional   OB History    Gravida Para Term Preterm AB TAB SAB Ectopic Multiple Living   2 2 2  0 0 0 0 0 0 2     Review of Systems  Constitutional: Negative for fever.  Gastrointestinal: Negative for abdominal pain.  Genitourinary: Positive for vaginal  discharge. Negative for dysuria, hematuria and vaginal bleeding.  All other systems reviewed and are negative.     Allergies  Review of patient's allergies indicates no known allergies.  Home Medications   Prior to Admission medications   Medication Sig Start Date End Date Taking? Authorizing Provider  acetaminophen (TYLENOL) 500 MG tablet Take 1,000 mg by mouth every 6 (six) hours as needed. For headache.    Historical Provider, MD  diphenhydrAMINE (BENADRYL) 25 MG tablet Take 1 tablet (25 mg total) by mouth every 6 (six) hours as needed for itching (Rash). 11/16/13   Emilia BeckKaitlyn Szekalski, PA-C  doxycycline (VIBRAMYCIN) 100 MG capsule Take 1 capsule (100 mg total) by mouth 2 (two) times daily. 01/20/13   Hilario Quarryanielle S Ray, MD  HYDROcodone-acetaminophen (NORCO/VICODIN) 5-325 MG per tablet Take 1 tablet by mouth every 6 (six) hours as needed for severe pain. 09/13/13   Gerhard Munchobert Lockwood, MD  HYDROcodone-homatropine [facility](HYCODAN) 5-1.5 MG/5ML syrup Take 5 mLs by mouth every 6 (six) hours as needed. 11/16/13   Kaitlyn Szekalski, PA-C  ibuprofen (ADVIL,MOTRIN) 200 MG tablet Take 600 mg by mouth every 6 (six) hours as needed. For headache.    Historical Provider, MD  ondansetron (ZOFRAN ODT) 8 MG disintegrating tablet Take 1 tablet (8 mg total) by mouth every 8 (eight) hours as needed for nausea. 01/20/13   Hilario Quarryanielle S Ray, MD  oxyCODONE-acetaminophen (PERCOCET/ROXICET) 5-325 MG per tablet Take 1 tablet by mouth every 4 (four) hours as needed for pain. 01/20/13   Hilario Quarryanielle S Ray, MD   BP 150/87 mmHg  Pulse 79  Temp(Src) 98.9 F (37.2 C) (Oral)  Resp 18  SpO2 98% Physical Exam  Constitutional: She is oriented to person, place, and time. She appears well-developed and well-nourished. No distress.  Morbidly obese  HENT:  Head: Normocephalic and atraumatic.  Right Ear: External ear normal.  Left Ear: External ear normal.  Nose: Nose normal.  Eyes: Right eye exhibits no discharge. Left eye exhibits no  discharge.  Cardiovascular: Normal rate, regular rhythm and normal heart sounds.   Pulmonary/Chest: Effort normal and breath sounds normal.  Abdominal: Soft. There is no tenderness.  Genitourinary: Uterus is not enlarged and not tender. Cervix exhibits no motion tenderness. Right adnexum displays no mass. Left adnexum displays no mass. Vaginal discharge found.  White, lumpy discharge present in vagina. Mild external irritation  Neurological: She is alert and oriented to person, place, and time.  Skin: Skin is warm and dry. She is not diaphoretic.  Nursing note and vitals reviewed.   ED Course  Procedures (including critical care time) Labs Review Labs Reviewed  WET PREP, GENITAL - Abnormal; Notable for the following:    Yeast Wet Prep HPF POC TOO NUMEROUS TO COUNT (*)    Clue Cells Wet Prep HPF POC TOO NUMEROUS TO COUNT (*)    WBC, Wet Prep HPF POC TOO NUMEROUS TO COUNT (*)    All other components within normal limits  GC/CHLAMYDIA PROBE AMP  PREGNANCY, URINE  HIV ANTIBODY (ROUTINE TESTING)    Imaging Review No results found.   EKG Interpretation None      MDM   Final diagnoses:  Candidal vulvovaginitis    Patient appears to have candidal vulvovaginitis. No prior history of diabetes noted. Patient will be treated with one dose of Diflucan now and be given a prescription for 1 more dose in 3 days if symptoms have not resolved. There are many clue cells noted, but patient's discharge is consistent with a yeast infection and she is not having fishy odor to suggest BV. I discussed these results with her and noted that if she were to notice worse symptoms, distinct odor, or symptoms do not resolve she should follow-up with GYN and may need treatment for BV.    Audree CamelScott T Zitlaly Malson, MD 09/23/14 1007

## 2014-09-25 LAB — GC/CHLAMYDIA PROBE AMP
CT PROBE, AMP APTIMA: NEGATIVE
GC PROBE AMP APTIMA: NEGATIVE

## 2015-01-24 ENCOUNTER — Encounter (HOSPITAL_BASED_OUTPATIENT_CLINIC_OR_DEPARTMENT_OTHER): Payer: Self-pay

## 2015-01-24 ENCOUNTER — Emergency Department (HOSPITAL_BASED_OUTPATIENT_CLINIC_OR_DEPARTMENT_OTHER)
Admission: EM | Admit: 2015-01-24 | Discharge: 2015-01-24 | Disposition: A | Discharge status: Home / Self Care | Payer: Medicaid Other | Attending: Emergency Medicine | Admitting: Emergency Medicine

## 2015-01-24 DIAGNOSIS — Z79899 Other long term (current) drug therapy: Secondary | ICD-10-CM | POA: Diagnosis not present

## 2015-01-24 DIAGNOSIS — Z862 Personal history of diseases of the blood and blood-forming organs and certain disorders involving the immune mechanism: Secondary | ICD-10-CM | POA: Diagnosis not present

## 2015-01-24 DIAGNOSIS — N939 Abnormal uterine and vaginal bleeding, unspecified: Secondary | ICD-10-CM | POA: Diagnosis present

## 2015-01-24 DIAGNOSIS — N730 Acute parametritis and pelvic cellulitis: Secondary | ICD-10-CM

## 2015-01-24 DIAGNOSIS — R000 Tachycardia, unspecified: Secondary | ICD-10-CM | POA: Diagnosis not present

## 2015-01-24 DIAGNOSIS — A5901 Trichomonal vulvovaginitis: Secondary | ICD-10-CM | POA: Insufficient documentation

## 2015-01-24 DIAGNOSIS — A64 Unspecified sexually transmitted disease: Secondary | ICD-10-CM

## 2015-01-24 DIAGNOSIS — A599 Trichomoniasis, unspecified: Secondary | ICD-10-CM

## 2015-01-24 LAB — CBC WITH DIFFERENTIAL/PLATELET
BASOS ABS: 0 10*3/uL (ref 0.0–0.1)
BASOS PCT: 0 % (ref 0–1)
Eosinophils Absolute: 0 10*3/uL (ref 0.0–0.7)
Eosinophils Relative: 0 % (ref 0–5)
HCT: 32.8 % — ABNORMAL LOW (ref 36.0–46.0)
HEMOGLOBIN: 11.7 g/dL — AB (ref 12.0–15.0)
LYMPHS ABS: 1.7 10*3/uL (ref 0.7–4.0)
Lymphocytes Relative: 28 % (ref 12–46)
MCH: 27 pg (ref 26.0–34.0)
MCHC: 35.7 g/dL (ref 30.0–36.0)
MCV: 75.6 fL — ABNORMAL LOW (ref 78.0–100.0)
MONOS PCT: 23 % — AB (ref 3–12)
Monocytes Absolute: 1.4 10*3/uL — ABNORMAL HIGH (ref 0.1–1.0)
NEUTROS ABS: 2.8 10*3/uL (ref 1.7–7.7)
NEUTROS PCT: 49 % (ref 43–77)
Platelets: 246 10*3/uL (ref 150–400)
RBC: 4.34 MIL/uL (ref 3.87–5.11)
RDW: 15.2 % (ref 11.5–15.5)
WBC: 5.9 10*3/uL (ref 4.0–10.5)

## 2015-01-24 LAB — BASIC METABOLIC PANEL
ANION GAP: 8 (ref 5–15)
BUN: 8 mg/dL (ref 6–23)
CALCIUM: 8.5 mg/dL (ref 8.4–10.5)
CHLORIDE: 100 mmol/L (ref 96–112)
CO2: 28 mmol/L (ref 19–32)
Creatinine, Ser: 0.81 mg/dL (ref 0.50–1.10)
GFR calc Af Amer: >90 mL/min (ref >90)
GFR calc non Af Amer: >90 mL/min (ref >90)
GLUCOSE: 104 mg/dL — AB (ref 70–99)
Potassium: 3.3 mmol/L — ABNORMAL LOW (ref 3.5–5.1)
Sodium: 136 mmol/L (ref 135–145)

## 2015-01-24 LAB — WET PREP, GENITAL: YEAST WET PREP: NONE SEEN

## 2015-01-24 MED ORDER — DOXYCYCLINE HYCLATE 100 MG PO CAPS: 100.0000 mg | ORAL_CAPSULE | Freq: Two times a day (BID) | ORAL | Status: DC

## 2015-01-24 MED ORDER — CEFTRIAXONE SODIUM 250 MG IJ SOLR
250.0000 mg | Freq: Once | INTRAMUSCULAR | Status: AC
  Administered 2015-01-24: 250 mg via INTRAMUSCULAR
  Filled 2015-01-24: qty 250

## 2015-01-24 MED ORDER — METRONIDAZOLE 500 MG PO TABS
2000.0000 mg | ORAL_TABLET | Freq: Once | ORAL | Status: AC
  Administered 2015-01-24: 2000 mg via ORAL
  Filled 2015-01-24: qty 4

## 2015-01-24 MED ORDER — KETOROLAC TROMETHAMINE 30 MG/ML IJ SOLN
30.0000 mg | Freq: Once | INTRAMUSCULAR | Status: AC
  Administered 2015-01-24: 30 mg via INTRAVENOUS
  Filled 2015-01-24: qty 1

## 2015-01-24 MED ORDER — OXYCODONE-ACETAMINOPHEN 5-325 MG PO TABS
2.0000 | ORAL_TABLET | Freq: Once | ORAL | Status: AC
  Administered 2015-01-24: 2 via ORAL
  Filled 2015-01-24: qty 2

## 2015-01-24 MED ORDER — ONDANSETRON 4 MG PO TBDP
4.0000 mg | ORAL_TABLET | Freq: Once | ORAL | Status: AC
  Administered 2015-01-24: 4 mg via ORAL
  Filled 2015-01-24: qty 1

## 2015-01-24 MED ORDER — IBUPROFEN 600 MG PO TABS: 600.0000 mg | ORAL_TABLET | Freq: Four times a day (QID) | ORAL | Status: DC | PRN

## 2015-01-24 MED ORDER — SODIUM CHLORIDE 0.9 % IV BOLUS (SEPSIS)
1000.0000 mL | Freq: Once | INTRAVENOUS | Status: AC
  Administered 2015-01-24: 1000 mL via INTRAVENOUS

## 2015-01-24 MED ORDER — LIDOCAINE HCL (PF) 1 % IJ SOLN
INTRAMUSCULAR | Status: AC
  Administered 2015-01-24: 5 mL
  Filled 2015-01-24: qty 5

## 2015-01-24 NOTE — Discharge Instructions (Signed)
Pelvic Inflammatory Disease Pelvic inflammatory disease (PID) is an infection in some or all of the female organs. PID can be in the uterus, ovaries, fallopian tubes, or the surrounding tissues inside the lower belly area (pelvis). HOME CARE   If given, take your antibiotic medicine as told. Finish them even if you start to feel better.  Only take medicine as told by your doctor.  Do not have sex (intercourse) until treatment is done or as told by your doctor.  Tell your sex partner if you have PID. Your partner may need to be treated.  Keep all doctor visits. GET HELP RIGHT AWAY IF:   You have a fever.  You have more belly (abdominal) or lower belly pain.  You have chills.  You have pain when you pee (urinate).  You are not better after 72 hours.  You have more fluid (discharge) coming from your vagina or fluid that is not normal.  You need pain medicine from your doctor.  You throw up (vomit).  You cannot take your medicines.  Your partner has a sexually transmitted disease (STD). MAKE SURE YOU:   Understand these instructions.  Will watch your condition.  Will get help right away if you are not doing well or get worse. Document Released: 01/08/2009 Document Revised: 02/06/2013 Document Reviewed: 10/08/2011 [facility] Patient Information 2015 [place], [place]. This information is not intended to replace advice given to you by your health care provider. Make sure you discuss any questions you have with your health care provider.  Sexually Transmitted Disease A sexually transmitted disease (STD) is a disease or infection that may be passed (transmitted) from person to person, usually during sexual activity. This may happen by way of saliva, semen, blood, vaginal mucus, or urine. Common STDs include:   Gonorrhea.   Chlamydia.   Syphilis.   HIV and AIDS.   Genital herpes.   Hepatitis B and C.   Trichomonas.   Human papillomavirus (HPV).   Pubic lice.    Scabies.  Mites.  Bacterial vaginosis. WHAT ARE CAUSES OF STDs? An STD may be caused by bacteria, a virus, or parasites. STDs are often transmitted during sexual activity if one person is infected. However, they may also be transmitted through nonsexual means. STDs may be transmitted after:   Sexual intercourse with an infected person.   Sharing sex toys with an infected person.   Sharing needles with an infected person or using unclean piercing or tattoo needles.  Having intimate contact with the genitals, mouth, or rectal areas of an infected person.   Exposure to infected fluids during birth. WHAT ARE THE SIGNS AND SYMPTOMS OF STDs? Different STDs have different symptoms. Some people may not have any symptoms. If symptoms are present, they may include:   Painful or bloody urination.   Pain in the pelvis, abdomen, vagina, anus, throat, or eyes.   A skin rash, itching, or irritation.  Growths, ulcerations, blisters, or sores in the genital and anal areas.  Abnormal vaginal discharge with or without bad odor.   Penile discharge in men.   Fever.   Pain or bleeding during sexual intercourse.   Swollen glands in the groin area.   Yellow skin and eyes (jaundice). This is seen with hepatitis.   Swollen testicles.  Infertility.  Sores and blisters in the mouth. HOW ARE STDs DIAGNOSED? To make a diagnosis, your health care provider may:   Take a medical history.   Perform a physical exam.   Take a sample  of any discharge to examine.  Swab the throat, cervix, opening to the penis, rectum, or vagina for testing.  Test a sample of your first morning urine.   Perform blood tests.   Perform a Pap test, if this applies.   Perform a colposcopy.   Perform a laparoscopy.  HOW ARE STDs TREATED? Treatment depends on the STD. Some STDs may be treated but not cured.   Chlamydia, gonorrhea, trichomonas, and syphilis can be cured with antibiotic  medicine.   Genital herpes, hepatitis, and HIV can be treated, but not cured, with prescribed medicines. The medicines lessen symptoms.   Genital warts from HPV can be treated with medicine or by freezing, burning (electrocautery), or surgery. Warts may come back.   HPV cannot be cured with medicine or surgery. However, abnormal areas may be removed from the cervix, vagina, or vulva.   If your diagnosis is confirmed, your recent sexual partners need treatment. This is true even if they are symptom-free or have a negative culture or evaluation. They should not have sex until their health care providers say it is okay. HOW CAN I REDUCE MY RISK OF GETTING AN STD? Take these steps to reduce your risk of getting an STD:  Use latex condoms, dental dams, and water-soluble lubricants during sexual activity. Do not use petroleum jelly or oils.  Avoid having multiple sex partners.  Do not have sex with someone who has other sex partners.  Do not have sex with anyone you do not know or who is at high risk for an STD.  Avoid risky sex practices that can break your skin.  Do not have sex if you have open sores on your mouth or skin.  Avoid drinking too much alcohol or taking illegal drugs. Alcohol and drugs can affect your judgment and put you in a vulnerable position.  Avoid engaging in oral and anal sex acts.  Get vaccinated for HPV and hepatitis. If you have not received these vaccines in the past, talk to your health care provider about whether one or both might be right for you.   If you are at risk of being infected with HIV, it is recommended that you take a prescription medicine daily to prevent HIV infection. This is called pre-exposure prophylaxis (PrEP). You are considered at risk if:  You are a man who has sex with other men (MSM).  You are a heterosexual man or woman and are sexually active with more than one partner.  You take drugs by injection.  You are sexually active  with a partner who has HIV.  Talk with your health care provider about whether you are at high risk of being infected with HIV. If you choose to begin PrEP, you should first be tested for HIV. You should then be tested every 3 months for as long as you are taking PrEP.  WHAT SHOULD I DO IF I THINK I HAVE AN STD?  See your health care provider.   Tell your sexual partner(s). They should be tested and treated for any STDs.  Do not have sex until your health care provider says it is okay. WHEN SHOULD I GET IMMEDIATE MEDICAL CARE? Contact your health care provider right away if:   You have severe abdominal pain.  You are a man and notice swelling or pain in your testicles.  You are a woman and notice swelling or pain in your vagina. Document Released: 01/02/2003 Document Revised: 10/17/2013 Document Reviewed: 05/02/2013 [employer] Patient Information 2015  [employer]. This information is not intended to replace advice given to you by your health care provider. Make sure you discuss any questions you have with your health care provider.  You were evaluated in the ED today for your vaginal bleeding and abdominal discomfort. You're found to have pelvic inflammatory disease. It is important for you to take all your medications as prescribed. Please follow-up with your OB/GYN for further evaluation and management of your symptoms. Return to ED for new or worsening symptoms.

## 2015-01-24 NOTE — ED Provider Notes (Signed)
CSN: [id number]     Arrival date & time 01/24/15  1713 History   First MD Initiated Contact with Patient 01/24/15 1736     Chief Complaint  Patient presents with  . Vaginal Bleeding     (Consider location/radiation/quality/duration/timing/severity/associated sxs/prior Treatment) HPI Kathleen Watkins is a 63 P 2012 28 y.o. female who comes in for evaluation of abdominal discomfort and vaginal bleeding following a D&C procedure on Wednesday of last week. Patient states she was in [place] and had her D&C done by Dr. Clovis RileyMitchell. She states feeling well for a few days but then on Tuesday of this week she began to have a gradual onset lower abdomen cramping bilaterally that radiated to her back area. She likens the discomfort to starting her menses as her discomfort is similar. She has tried Midol, Aleve and oxycodone without relief of her symptoms. Rates her current discomfort as a 20/10.  She denies any fevers, nausea, vomiting, urinary symptoms, vaginal discharge, abnormal bowel movements.  Past Medical History  Diagnosis Date  . STD (female)     Trich-tx'd in 11/2010,   . Obese   . Anemia    Past Surgical History  Procedure Laterality Date  . Uterine ateriography  12/21/2005    bilateral  . Dilation and curettage of uterus  2007  . Cesarean section    . Hernia repair    . Cystectomy    . Tonsillectomy    . Adenoidectomy    . Tooth extraction    . Cesarean section  08/10/2011    Procedure: CESAREAN SECTION;  Surgeon: Tereso NewcomerUgonna A Anyanwu, MD;  Location: [facility];  Service: Gynecology;  Laterality: N/A;   Family History  Problem Relation Age of Onset  . Diabetes Maternal Grandfather   . Hypertension Father    History  Substance Use Topics  . Smoking status: Never Smoker   . Smokeless tobacco: Never Used  . Alcohol Use: No     Comment: occasional   OB History    Gravida Para Term Preterm AB TAB SAB Ectopic Multiple Living   2 2 2  0 0 0 0 0 0 2     Review of Systems A 10 point  review of systems was completed and was negative except for pertinent positives and negatives as mentioned in the history of present illness     Allergies  Review of patient's allergies indicates no known allergies.  Home Medications   Prior to Admission medications   Medication Sig Start Date End Date Taking? Authorizing Provider  acetaminophen (TYLENOL) 500 MG tablet Take 1,000 mg by mouth every 6 (six) hours as needed. For headache.    Historical Provider, MD  diphenhydrAMINE (BENADRYL) 25 MG tablet Take 1 tablet (25 mg total) by mouth every 6 (six) hours as needed for itching (Rash). 11/16/13   Emilia BeckKaitlyn Szekalski, PA-C  doxycycline (VIBRAMYCIN) 100 MG capsule Take 1 capsule (100 mg total) by mouth 2 (two) times daily. One po bid x 7 days 01/24/15   Joycie PeekBenjamin Arlan Birks, PA-C  fluconazole (DIFLUCAN) 150 MG tablet Take 1 tablet (150 mg total) by mouth once. 09/26/14   Pricilla LovelessScott Goldston, MD  HYDROcodone-acetaminophen (NORCO/VICODIN) 5-325 MG per tablet Take 1 tablet by mouth every 6 (six) hours as needed for severe pain. 09/13/13   Gerhard Munchobert Lockwood, MD  HYDROcodone-homatropine [facility](HYCODAN) 5-1.5 MG/5ML syrup Take 5 mLs by mouth every 6 (six) hours as needed. 11/16/13   Kaitlyn Szekalski, PA-C  ibuprofen (ADVIL,MOTRIN) 600 MG tablet Take 1 tablet (600 mg  total) by mouth every 6 (six) hours as needed. 01/24/15   Joycie Peek, PA-C  ondansetron (ZOFRAN ODT) 8 MG disintegrating tablet Take 1 tablet (8 mg total) by mouth every 8 (eight) hours as needed for nausea. 01/20/13   Margarita Grizzle, MD  oxyCODONE-acetaminophen (PERCOCET/ROXICET) 5-325 MG per tablet Take 1 tablet by mouth every 4 (four) hours as needed for pain. 01/20/13   Margarita Grizzle, MD   BP 111/74 mmHg  Pulse 90  Temp(Src) 98.3 F (36.8 C) (Oral)  Resp 20  Ht  (1.702 m)  Wt 352 lb (159.666 kg)  BMI 55.12 kg/m2  SpO2 100% Physical Exam  Constitutional: She is oriented to person, place, and time. She appears well-developed and  well-nourished.  Morbidly obese  HENT:  Head: Normocephalic and atraumatic.  Mouth/Throat: Oropharynx is clear and moist.  Eyes: Conjunctivae and EOM are normal. Pupils are equal, round, and reactive to light. Right eye exhibits no discharge. Left eye exhibits no discharge. No scleral icterus.  Neck: Neck supple.  Cardiovascular: Regular rhythm and normal heart sounds.  Exam reveals no gallop and no friction rub.   No murmur heard. Mildly tachycardic  Pulmonary/Chest: Effort normal and breath sounds normal. No respiratory distress. She has no wheezes. She has no rales.  Abdominal: Soft.  Mild diffuse tenderness throughout abdomen without any focal tenderness. No rebound or guarding. No obvious distention although patient is morbidly obese. No other deformities or masses noted  Genitourinary:  Chaperone was present for the entire genital exam. Exam limited by patient body habitus No lesions or rashes appreciated on vulva. Cervix visualized on speculum exam and appropriate cultures sampled. Moderate blood in vaginal vault. Discharge: None appreciated Upon bi manual exam- No TTP of the adnexa, however there is tenderness with direct palpation over the center of the uterus without cervical motion tenderness. No fullness or masses appreciated. No abnormalities appreciated in structural anatomy.   Musculoskeletal: She exhibits no tenderness.  Neurological: She is alert and oriented to person, place, and time.  Cranial Nerves II-XII grossly intact  Skin: Skin is warm and dry. No rash noted.  Psychiatric: She has a normal mood and affect.  Nursing note and vitals reviewed.   ED Course  Procedures (including critical care time) Labs Review Labs Reviewed  WET PREP, GENITAL - Abnormal; Notable for the following:    Trich, Wet Prep MANY (*)    Clue Cells Wet Prep HPF POC FEW (*)    WBC, Wet Prep HPF POC FEW (*)    All other components within normal limits  CBC WITH DIFFERENTIAL/PLATELET -  Abnormal; Notable for the following:    Hemoglobin 11.7 (*)    HCT 32.8 (*)    MCV 75.6 (*)    Monocytes Relative 23 (*)    Monocytes Absolute 1.4 (*)    All other components within normal limits  BASIC METABOLIC PANEL - Abnormal; Notable for the following:    Potassium 3.3 (*)    Glucose, Bld 104 (*)    All other components within normal limits  HIV ANTIBODY (ROUTINE TESTING)  GC/CHLAMYDIA PROBE AMP ([place])    Imaging Review No results found.   EKG Interpretation None      Meds given in ED:  Medications  ketorolac (TORADOL) 30 MG/ML injection 30 mg (30 mg Intravenous Given 01/24/15 1819)  cefTRIAXone (ROCEPHIN) injection 250 mg (250 mg Intramuscular Given 01/24/15 2101)  metroNIDAZOLE (FLAGYL) tablet 2,000 mg (2,000 mg Oral Given 01/24/15 2103)  sodium chloride 0.9 %  bolus 1,000 mL (0 mLs Intravenous Stopped 01/24/15 2139)  lidocaine (PF) (XYLOCAINE) 1 % injection (5 mLs  Given 01/24/15 2102)  ondansetron (ZOFRAN-ODT) disintegrating tablet 4 mg (4 mg Oral Given 01/24/15 2137)  oxyCODONE-acetaminophen (PERCOCET/ROXICET) 5-325 MG per tablet 2 tablet (2 tablets Oral Given 01/24/15 2137)    Discharge Medication List as of 01/24/2015  9:17 PM     Filed Vitals:   01/24/15 1721 01/24/15 2108  BP: 133/67 111/74  Pulse: 113 90  Temp: 98.7 F (37.1 C) 98.3 F (36.8 C)  TempSrc: Oral Oral  Resp: 20 20  Height:  (1.702 m)   Weight: 352 lb (159.666 kg)   SpO2: 100% 100%     MDM  Vitals stable - WNL -afebrile Pt resting comfortably in ED. PE--patient with diffuse lower abdominal discomfort and discomfort with anterior uterine palpation on bimanual exam. Otherwise benign physical exam Labwork: Wet prep shows many Trichomonas and white blood cells. No evidence of anemia. Otherwise noncontributory  DDX--No hemorrhage or evidence of GYN emergency. Will treat empirically for PID. Discussed with patient follow-up with OB for further evaluation and management of her symptoms.  No evidence of other acute or emergent pathology at this time. Will DC with anti-inflammatories and pain medicines.  I discussed all relevant lab findings and imaging results with pt and they verbalized understanding. Discussed f/u with PCP within 48 hrs and return precautions, pt very amenable to plan. Prior to patient discharge, I discussed and reviewed this case with Dr.Delo   Final diagnoses:  PID (acute pelvic inflammatory disease)  STI (sexually transmitted infection)  Trichimoniasis        Joycie Peek, PA-C 01/25/15 1425  Geoffery Lyons, MD 01/26/15 2308

## 2015-01-24 NOTE — ED Notes (Signed)
Reports abortion performed on last Wednesday. Reports increased pain, pressure and bleeding since Tuesdays. [place]. Pts OB is MD Clovis RileyMitchell

## 2015-01-25 LAB — GC/CHLAMYDIA PROBE AMP (~~LOC~~) NOT AT ARMC
Chlamydia: NEGATIVE
Neisseria Gonorrhea: NEGATIVE

## 2015-01-26 LAB — HIV ANTIBODY (ROUTINE TESTING W REFLEX): HIV Screen 4th Generation wRfx: NONREACTIVE

## 2015-10-28 ENCOUNTER — Encounter: Payer: Medicaid Other | Admitting: Obstetrics & Gynecology

## 2015-12-13 ENCOUNTER — Other Ambulatory Visit: Payer: Self-pay | Admitting: Surgical Oncology

## 2015-12-17 ENCOUNTER — Other Ambulatory Visit: Payer: Medicaid Other

## 2015-12-19 ENCOUNTER — Ambulatory Visit
Admission: RE | Admit: 2015-12-19 | Discharge: 2015-12-19 | Disposition: A | Discharge status: Home / Self Care | Payer: BLUE CROSS/BLUE SHIELD | Source: Ambulatory Visit | Attending: Surgical Oncology | Admitting: Surgical Oncology

## 2016-01-07 DIAGNOSIS — R5383 Other fatigue: Secondary | ICD-10-CM | POA: Insufficient documentation

## 2016-01-07 DIAGNOSIS — J309 Allergic rhinitis, unspecified: Secondary | ICD-10-CM | POA: Insufficient documentation

## 2016-02-25 ENCOUNTER — Inpatient Hospital Stay (HOSPITAL_COMMUNITY)
Admission: AD | Admit: 2016-02-25 | Discharge: 2016-02-25 | Disposition: A | Discharge status: Home / Self Care | Payer: BLUE CROSS/BLUE SHIELD | Source: Ambulatory Visit | Attending: Obstetrics & Gynecology | Admitting: Obstetrics & Gynecology

## 2016-02-25 ENCOUNTER — Encounter (HOSPITAL_COMMUNITY): Payer: Self-pay | Admitting: *Deleted

## 2016-02-25 ENCOUNTER — Other Ambulatory Visit: Payer: Self-pay | Admitting: Obstetrics & Gynecology

## 2016-02-25 ENCOUNTER — Inpatient Hospital Stay (HOSPITAL_COMMUNITY): Payer: BLUE CROSS/BLUE SHIELD

## 2016-02-25 DIAGNOSIS — E669 Obesity, unspecified: Secondary | ICD-10-CM | POA: Insufficient documentation

## 2016-02-25 DIAGNOSIS — A599 Trichomoniasis, unspecified: Secondary | ICD-10-CM | POA: Insufficient documentation

## 2016-02-25 DIAGNOSIS — N83292 Other ovarian cyst, left side: Secondary | ICD-10-CM

## 2016-02-25 DIAGNOSIS — R1032 Left lower quadrant pain: Secondary | ICD-10-CM

## 2016-02-25 DIAGNOSIS — N939 Abnormal uterine and vaginal bleeding, unspecified: Secondary | ICD-10-CM | POA: Insufficient documentation

## 2016-02-25 DIAGNOSIS — N83202 Unspecified ovarian cyst, left side: Secondary | ICD-10-CM | POA: Insufficient documentation

## 2016-02-25 LAB — CBC WITH DIFFERENTIAL/PLATELET
BASOS ABS: 0 10*3/uL (ref 0.0–0.1)
Basophils Relative: 0 %
EOS ABS: 0.1 10*3/uL (ref 0.0–0.7)
EOS PCT: 2 %
HCT: 33.7 % — ABNORMAL LOW (ref 36.0–46.0)
Hemoglobin: 12 g/dL (ref 12.0–15.0)
LYMPHS PCT: 47 %
Lymphs Abs: 2.5 10*3/uL (ref 0.7–4.0)
MCH: 25.6 pg — ABNORMAL LOW (ref 26.0–34.0)
MCHC: 35.6 g/dL (ref 30.0–36.0)
MCV: 72 fL — AB (ref 78.0–100.0)
Monocytes Absolute: 0.3 10*3/uL (ref 0.1–1.0)
Monocytes Relative: 6 %
Neutro Abs: 2.4 10*3/uL (ref 1.7–7.7)
Neutrophils Relative %: 45 %
PLATELETS: 318 10*3/uL (ref 150–400)
RBC: 4.68 MIL/uL (ref 3.87–5.11)
RDW: 16 % — ABNORMAL HIGH (ref 11.5–15.5)
WBC: 5.4 10*3/uL (ref 4.0–10.5)

## 2016-02-25 LAB — WET PREP, GENITAL
SPERM: NONE SEEN
Yeast Wet Prep HPF POC: NONE SEEN

## 2016-02-25 LAB — URINALYSIS, ROUTINE W REFLEX MICROSCOPIC
BILIRUBIN URINE: NEGATIVE
Glucose, UA: NEGATIVE mg/dL
Ketones, ur: NEGATIVE mg/dL
Leukocytes, UA: NEGATIVE
Nitrite: NEGATIVE
Protein, ur: NEGATIVE mg/dL
Specific Gravity, Urine: 1.015 (ref 1.005–1.030)
pH: 7.5 (ref 5.0–8.0)

## 2016-02-25 LAB — URINE MICROSCOPIC-ADD ON: Bacteria, UA: NONE SEEN

## 2016-02-25 LAB — POCT PREGNANCY, URINE: Preg Test, Ur: NEGATIVE

## 2016-02-25 MED ORDER — OXYCODONE-ACETAMINOPHEN 5-325 MG PO TABS
2.0000 | ORAL_TABLET | Freq: Once | ORAL | Status: AC
  Administered 2016-02-25: 2 via ORAL
  Filled 2016-02-25: qty 2

## 2016-02-25 MED ORDER — KETOROLAC TROMETHAMINE 60 MG/2ML IM SOLN
60.0000 mg | Freq: Once | INTRAMUSCULAR | Status: AC
  Administered 2016-02-25: 60 mg via INTRAMUSCULAR
  Filled 2016-02-25: qty 2

## 2016-02-25 MED ORDER — IBUPROFEN 200 MG PO CAPS: 4.0000 | ORAL_CAPSULE | Freq: Three times a day (TID) | ORAL | Status: DC | PRN

## 2016-02-25 MED ORDER — METRONIDAZOLE 500 MG PO TABS
2000.0000 mg | ORAL_TABLET | Freq: Once | ORAL | Status: AC
  Administered 2016-02-25: 2000 mg via ORAL
  Filled 2016-02-25: qty 4

## 2016-02-25 MED ORDER — OXYCODONE-ACETAMINOPHEN 5-325 MG PO TABS: 1.0000 | ORAL_TABLET | Freq: Four times a day (QID) | ORAL | Status: DC | PRN

## 2016-02-25 NOTE — MAU Note (Addendum)
Been having a lot of abd pain, initially started back in Dec; abd pain, long cycles. Went to ER, had a CT scan (? Cyst).  Has since seen Dr Juliene PinaMody, had abn pap; has appt May 16.  Now having pain in low to mid back; pain goes across bottom of low back, up her back and down her legs (leg pain started about a month ago, started as tingling now is painful.  Left arm is now tingling. Has been bleeding since April 20.  [facility] felt like they were not taking her seriously so came over here.

## 2016-02-25 NOTE — MAU Note (Signed)
Patient returned from [place].

## 2016-02-25 NOTE — MAU Note (Signed)
Has had vaginal bleeding, prolonged cycles for about 3 weeks, gets a week break, then comes back. Now having lower back pain, sometimes radiates up the middle of her back. Also, having left leg and arm numbness constantly, limping when she walks. The numbness has been going on for 3 weeks, but worse now.

## 2016-02-25 NOTE — Discharge Instructions (Signed)
Ovarian Cyst An ovarian cyst is a fluid-filled sac that forms on an ovary. The ovaries are small organs that produce eggs in women. Various types of cysts can form on the ovaries. Most are not cancerous. Many do not cause problems, and they often go away on their own. Some may cause symptoms and require treatment. Common types of ovarian cysts include:  Functional cysts--These cysts may occur every month during the menstrual cycle. This is normal. The cysts usually go away with the next menstrual cycle if the woman does not get pregnant. Usually, there are no symptoms with a functional cyst.  Endometrioma cysts--These cysts form from the tissue that lines the uterus. They are also called "chocolate cysts" because they become filled with blood that turns brown. This type of cyst can cause pain in the lower abdomen during intercourse and with your menstrual period.  Cystadenoma cysts--This type develops from the cells on the outside of the ovary. These cysts can get very big and cause lower abdomen pain and pain with intercourse. This type of cyst can twist on itself, cut off its blood supply, and cause severe pain. It can also easily rupture and cause a lot of pain.  Dermoid cysts--This type of cyst is sometimes found in both ovaries. These cysts may contain different kinds of body tissue, such as skin, teeth, hair, or cartilage. They usually do not cause symptoms unless they get very big.  Theca lutein cysts--These cysts occur when too much of a certain hormone (human chorionic gonadotropin) is produced and overstimulates the ovaries to produce an egg. This is most common after procedures used to assist with the conception of a baby (in vitro fertilization). CAUSES   Fertility drugs can cause a condition in which multiple large cysts are formed on the ovaries. This is called ovarian hyperstimulation syndrome.  A condition called polycystic ovary syndrome can cause hormonal imbalances that can lead to  nonfunctional ovarian cysts. SIGNS AND SYMPTOMS  Many ovarian cysts do not cause symptoms. If symptoms are present, they may include:  Pelvic pain or pressure.  Pain in the lower abdomen.  Pain during sexual intercourse.  Increasing girth (swelling) of the abdomen.  Abnormal menstrual periods.  Increasing pain with menstrual periods.  Stopping having menstrual periods without being pregnant. DIAGNOSIS  These cysts are commonly found during a routine or annual pelvic exam. Tests may be ordered to find out more about the cyst. These tests may include:  Ultrasound.  X-ray of the pelvis.  CT scan.  MRI.  Blood tests. TREATMENT  Many ovarian cysts go away on their own without treatment. Your health care provider may want to check your cyst regularly for 2-3 months to see if it changes. For women in menopause, it is particularly important to monitor a cyst closely because of the higher rate of ovarian cancer in menopausal women. When treatment is needed, it may include any of the following:  A procedure to drain the cyst (aspiration). This may be done using a long needle and ultrasound. It can also be done through a laparoscopic procedure. This involves using a thin, lighted tube with a tiny camera on the end (laparoscope) inserted through a small incision.  Surgery to remove the whole cyst. This may be done using laparoscopic surgery or an open surgery involving a larger incision in the lower abdomen.  Hormone treatment or birth control pills. These methods are sometimes used to help dissolve a cyst. HOME CARE INSTRUCTIONS   Only take over-the-counter  or prescription medicines as directed by your health care provider.  Follow up with your health care provider as directed.  Get regular pelvic exams and Pap tests. SEEK MEDICAL CARE IF:   Your periods are late, irregular, or painful, or they stop.  Your pelvic pain or abdominal pain does not go away.  Your abdomen becomes  larger or swollen.  You have pressure on your bladder or trouble emptying your bladder completely.  You have pain during sexual intercourse.  You have feelings of fullness, pressure, or discomfort in your stomach.  You lose weight for no apparent reason.  You feel generally ill.  You become constipated.  You lose your appetite.  You develop acne.  You have an increase in body and facial hair.  You are gaining weight, without changing your exercise and eating habits.  You think you are pregnant. SEEK IMMEDIATE MEDICAL CARE IF:   You have increasing abdominal pain.  You feel sick to your stomach (nauseous), and you throw up (vomit).  You develop a fever that comes on suddenly.  You have abdominal pain during a bowel movement.  Your menstrual periods become heavier than usual. MAKE SURE YOU:  Understand these instructions.  Will watch your condition.  Will get help right away if you are not doing well or get worse.   This information is not intended to replace advice given to you by your health care provider. Make sure you discuss any questions you have with your health care provider.   Document Released: 10/12/2005 Document Revised: 10/17/2013 Document Reviewed: 06/19/2013 Elsevier Interactive Patient Education 2016 [employer].  Trichomoniasis Trichomoniasis is an infection caused by an organism called Trichomonas. The infection can affect both women and men. In women, the outer female genitalia and the vagina are affected. In men, the penis is mainly affected, but the prostate and other reproductive organs can also be involved. Trichomoniasis is a sexually transmitted infection (STI) and is most often passed to another person through sexual contact.  RISK FACTORS  Having unprotected sexual intercourse.  Having sexual intercourse with an infected partner. SIGNS AND SYMPTOMS  Symptoms of trichomoniasis in women include:  Abnormal gray-green frothy vaginal  discharge.  Itching and irritation of the vagina.  Itching and irritation of the area outside the vagina. Symptoms of trichomoniasis in men include:   Penile discharge with or without pain.  Pain during urination. This results from inflammation of the urethra. DIAGNOSIS  Trichomoniasis may be found during a Pap test or physical exam. Your health care provider may use one of the following methods to help diagnose this infection:  Testing the pH of the vagina with a test tape.  Using a vaginal swab test that checks for the Trichomonas organism. A test is available that provides results within a few minutes.  Examining a urine sample.  Testing vaginal secretions. Your health care provider may test you for other STIs, including HIV. TREATMENT   You may be given medicine to fight the infection. Women should inform their health care provider if they could be or are pregnant. Some medicines used to treat the infection should not be taken during pregnancy.  Your health care provider may recommend over-the-counter medicines or creams to decrease itching or irritation.  Your sexual partner will need to be treated if infected.  Your health care provider may test you for infection again 3 months after treatment. HOME CARE INSTRUCTIONS   Take medicines only as directed by your health care provider.  Take  over-the-counter medicine for itching or irritation as directed by your health care provider.  Do not have sexual intercourse while you have the infection.  Women should not douche or wear tampons while they have the infection.  Discuss your infection with your partner. Your partner may have gotten the infection from you, or you may have gotten it from your partner.  Have your sex partner get examined and treated if necessary.  Practice safe, informed, and protected sex.  See your health care provider for other STI testing. SEEK MEDICAL CARE IF:   You still have symptoms after you  finish your medicine.  You develop abdominal pain.  You have pain when you urinate.  You have bleeding after sexual intercourse.  You develop a rash.  Your medicine makes you sick or makes you throw up (vomit). MAKE SURE YOU:  Understand these instructions.  Will watch your condition.  Will get help right away if you are not doing well or get worse.   This information is not intended to replace advice given to you by your health care provider. Make sure you discuss any questions you have with your health care provider.   Document Released: 04/07/2001 Document Revised: 11/02/2014 Document Reviewed: 07/24/2013 Elsevier Interactive Patient Education [employer]2016 Elsevier Inc.

## 2016-02-25 NOTE — [facility] Provider Note (Signed)
Chief Complaint: Vaginal Bleeding; Back Pain; Leg Pain; and Numbness   First Provider Initiated Contact with Patient 02/25/16 1133     SUBJECTIVE HPI: Kathleen Watkins is a 30 y.o. [id number] female who presents to [facility] reporting LLQ pain since December 2016 that corresponds w/ Menstrual bleeding and is worsening and prolonged menstrual bleeding since February 13, 2016. Was seen at [facility] for this problem (was also Dx's w/ [facility]). States she had a CT that may have shown a cyst. Was seen by Dr. Juliene Pina at [facility] for this problem. Has Pap (ASCUS) adn has F/U appt for [place] scheduled for 03/10/16, but was in so much pain that she should wait that long. Called the office to move up appt, but she could be seen any sooner so she came to MAU.   CT from 09/2015 shows 6.1 cm rounded lesion in the left adnexal region which could reflect a pedunculated fibroid or left ovarian hemorrhagic cyst. Umbilical hernia containing fat.  Location: LLQ Quality: Sharp pressure Severity: 10/10 on pain scale Duration: 5 months Course: Worsening Timing: intermittent Modifying factors: Minimal improvement w/ Midol, Aleve, Tylenol Associated signs and symptoms: Neg for fever, chills, GI complaints, urinary complaints, dyspareunia, vaginal discharge.   Past Medical History  Diagnosis Date  . STD (female)     Trich-tx'd in 11/2010,   . Obese   . Anemia    OB History  Gravida Para Term Preterm AB SAB TAB Ectopic Multiple Living  3 2 2  0 1 0 1 0 0 2    # Outcome Date GA Lbr Len/2nd Weight Sex Delivery Anes PTL Lv  3 Term 08/10/11 [redacted]w[redacted]d  7 lb 0.4 oz (3.187 kg) M CS-LTranv Spinal  Y  2 Term 10/2005 [redacted]w[redacted]d  6 lb 12 oz (3.062 kg) F CS-Unspec EPI N Y     Comments: GDM  1 TAB              Past Surgical History  Procedure Laterality Date  . Uterine ateriography  12/21/2005    bilateral  . Dilation and curettage of uterus  2007  . Cesarean section    . Hernia repair    . Cystectomy    .  Tonsillectomy    . Adenoidectomy    . Tooth extraction    . Cesarean section  08/10/2011    Procedure: CESAREAN SECTION;  Surgeon: Tereso Newcomer, MD;  Location: [facility];  Service: Gynecology;  Laterality: N/A;   Social History   Social History  . Marital Status: Single    Spouse Name: N/A  . Number of Children: N/A  . Years of Education: N/A   Occupational History  . Not on file.   Social History Main Topics  . Smoking status: Never Smoker   . Smokeless tobacco: Never Used  . Alcohol Use: No     Comment: occasional  . Drug Use: Yes    Special: Marijuana     Comment: History of marijuana use in past, none in 3 months  . Sexual Activity: Not Currently    Birth Control/ Protection: Implant, None   Other Topics Concern  . Not on file   Social History Narrative   No current facility-administered medications on file prior to encounter.   Current Outpatient Prescriptions on File Prior to Encounter  Medication Sig Dispense Refill  . acetaminophen (TYLENOL) 500 MG tablet Take 1,000 mg by mouth every 6 (six) hours as needed for moderate pain. For headache.  No Known Allergies  I have reviewed the past Medical Hx, Surgical Hx, Social Hx, Allergies and Medications.   Review of Systems  Constitutional: Negative for fever, chills and appetite change.  Gastrointestinal: Positive for abdominal pain. Negative for nausea, vomiting, diarrhea, constipation and blood in stool.  Genitourinary: Positive for pelvic pain. Negative for dysuria, urgency, frequency, hematuria, flank pain, vaginal bleeding, vaginal discharge, menstrual problem and dyspareunia.  Musculoskeletal: Negative for back pain.    OBJECTIVE Patient Vitals for the past 24 hrs:  BP Temp Temp src Pulse Resp Height Weight  02/25/16 1538 127/67 mmHg 97.7 F (36.5 C) Oral 102 16 - -  02/25/16 1019 131/92 mmHg 98.2 F (36.8 C) Oral 93 20 5' 7.5" (1.715 m) (!) 387 lb 8 oz (175.769 kg)   Constitutional:  Well-developed, well-nourished female in mild distress.  Cardiovascular: normal rate Respiratory: normal rate and effort.  GI: Abd soft, moderate left groin tenderness. Pos BS x 4 MS: Extremities nontender, no edema, normal ROM Neurologic: Alert and oriented x 4.  GU: Neg CVAT.  SPECULUM EXAM: NEFG, moderate thin, white, malodorous discharge, no blood noted, cervix clean  BIMANUAL: cervix closed; uterus normal size, but exam limited by body habitus. Left adnexal fullness and tenderness present. Normal right adnexa no. No CMT.  LAB RESULTS Results for orders placed or performed during the hospital encounter of 02/25/16 (from the past 24 hour(s))  Urinalysis, Routine w reflex microscopic (not at [facility])     Status: Abnormal   Collection Time: 02/25/16 10:20 AM  Result Value Ref Range   Color, Urine YELLOW YELLOW   APPearance CLEAR CLEAR   Specific Gravity, Urine 1.015 1.005 - 1.030   pH 7.5 5.0 - 8.0   Glucose, UA NEGATIVE NEGATIVE mg/dL   Hgb urine dipstick TRACE (A) NEGATIVE   Bilirubin Urine NEGATIVE NEGATIVE   Ketones, ur NEGATIVE NEGATIVE mg/dL   Protein, ur NEGATIVE NEGATIVE mg/dL   Nitrite NEGATIVE NEGATIVE   Leukocytes, UA NEGATIVE NEGATIVE  Urine microscopic-add on     Status: Abnormal   Collection Time: 02/25/16 10:20 AM  Result Value Ref Range   Squamous Epithelial / LPF 0-5 (A) NONE SEEN   WBC, UA 0-5 0 - 5 WBC/hpf   RBC / HPF 0-5 0 - 5 RBC/hpf   Bacteria, UA NONE SEEN NONE SEEN  Pregnancy, urine POC     Status: None   Collection Time: 02/25/16 10:30 AM  Result Value Ref Range   Preg Test, Ur NEGATIVE NEGATIVE  CBC with Differential/Platelet     Status: Abnormal   Collection Time: 02/25/16 11:12 AM  Result Value Ref Range   WBC 5.4 4.0 - 10.5 K/uL   RBC 4.68 3.87 - 5.11 MIL/uL   Hemoglobin 12.0 12.0 - 15.0 g/dL   HCT 16.1 (L) 09.6 - 04.5 %   MCV 72.0 (L) 78.0 - 100.0 fL   MCH 25.6 (L) 26.0 - 34.0 pg   MCHC 35.6 30.0 - 36.0 g/dL   RDW 40.9 (H) 81.1 - 91.4 %    Platelets 318 150 - 400 K/uL   Neutrophils Relative % 45 %   Neutro Abs 2.4 1.7 - 7.7 K/uL   Lymphocytes Relative 47 %   Lymphs Abs 2.5 0.7 - 4.0 K/uL   Monocytes Relative 6 %   Monocytes Absolute 0.3 0.1 - 1.0 K/uL   Eosinophils Relative 2 %   Eosinophils Absolute 0.1 0.0 - 0.7 K/uL   Basophils Relative 0 %   Basophils Absolute 0.0 0.0 -  0.1 K/uL  Wet prep, genital     Status: Abnormal   Collection Time: 02/25/16  1:07 PM  Result Value Ref Range   Yeast Wet Prep HPF POC NONE SEEN NONE SEEN   Trich, Wet Prep PRESENT (A) NONE SEEN   Clue Cells Wet Prep HPF POC PRESENT (A) NONE SEEN   WBC, Wet Prep HPF POC MODERATE (A) NONE SEEN   Sperm NONE SEEN     IMAGING US Transvaginal Non-ob  02/25/2016  CLINICAL DATA:  Pelvic pain since December. Low back and left leg pain and left leg numbness. History of endometriosis. EXAM: TRANSABDOMINAL AND TRANSVAGINAL ULTRASOUND OF PELVIS DOPPLER ULTRASOUND OF OVARIES TECHNIQUE: Both transabdominal and transvaginal ultrasound examinations of the pelvis were performed. Transabdominal technique was performed for global imaging of the pelvis including uterus, ovaries, adnexal regions, and pelvic cul-de-sac. It was necessary to proceed with endovaginal exam following the transabdominal exam to visualize the endometrium and ovaries to better advantage. Color and duplex Doppler ultrasound was utilized to evaluate blood flow to the ovaries. COMPARISON:  CT, 10/17/2015 FINDINGS: Uterus Measurements: 8.0 x 3.0 x 4.8 cm. Probable small, 10 mm, fibroid along the posterior left upper uterine segment. No other masses. Endometrium Thickness: 9 mm. Small amount of fluid in the endometrial canal. No endometrial mass. Right ovary Measurements: 3.6 x 1.9 x 3.2 cm. Normal appearance/no adnexal mass. Left ovary Measurements: 8.0 x 5.7 x 8.4 cm. Ovary is enlarged by complex cystic mass measuring 8 cm by 7.5 cm x 5.6 cm. This is likely a hemorrhagic ovarian cyst but is nonspecific. There  is no convincing internal blood flow within this. Normal arterial and venous blood flow seen in the presumed ovarian tissue draped along the margin of the cyst. No adnexal masses. Pulsed Doppler evaluation of both ovaries demonstrates normal low-resistance arterial and venous waveforms. Other findings No abnormal free fluid. IMPRESSION: 1. 8 cm complex left ovarian cystic mass, which is likely a hemorrhagic ovarian cyst although could reflect a cystic ovarian neoplasm. This was evident as a 6.1 cm cyst on the prior CT. Consider further evaluation and characterization of this mass with pelvic MRI with and without contrast. 2. 10 mm probable left uterine fibroid. 3. No other abnormalities. 4. No ovarian torsion. Electronically Signed   By: Amie Portland M.D.   On: 02/25/2016 14:50   US Pelvis Complete  02/25/2016  CLINICAL DATA:  Pelvic pain since December. Low back and left leg pain and left leg numbness. History of endometriosis. EXAM: TRANSABDOMINAL AND TRANSVAGINAL ULTRASOUND OF PELVIS DOPPLER ULTRASOUND OF OVARIES TECHNIQUE: Both transabdominal and transvaginal ultrasound examinations of the pelvis were performed. Transabdominal technique was performed for global imaging of the pelvis including uterus, ovaries, adnexal regions, and pelvic cul-de-sac. It was necessary to proceed with endovaginal exam following the transabdominal exam to visualize the endometrium and ovaries to better advantage. Color and duplex Doppler ultrasound was utilized to evaluate blood flow to the ovaries. COMPARISON:  CT, 10/17/2015 FINDINGS: Uterus Measurements: 8.0 x 3.0 x 4.8 cm. Probable small, 10 mm, fibroid along the posterior left upper uterine segment. No other masses. Endometrium Thickness: 9 mm. Small amount of fluid in the endometrial canal. No endometrial mass. Right ovary Measurements: 3.6 x 1.9 x 3.2 cm. Normal appearance/no adnexal mass. Left ovary Measurements: 8.0 x 5.7 x 8.4 cm. Ovary is enlarged by complex cystic mass  measuring 8 cm by 7.5 cm x 5.6 cm. This is likely a hemorrhagic ovarian cyst but is nonspecific. There is no  convincing internal blood flow within this. Normal arterial and venous blood flow seen in the presumed ovarian tissue draped along the margin of the cyst. No adnexal masses. Pulsed Doppler evaluation of both ovaries demonstrates normal low-resistance arterial and venous waveforms. Other findings No abnormal free fluid. IMPRESSION: 1. 8 cm complex left ovarian cystic mass, which is likely a hemorrhagic ovarian cyst although could reflect a cystic ovarian neoplasm. This was evident as a 6.1 cm cyst on the prior CT. Consider further evaluation and characterization of this mass with pelvic MRI with and without contrast. 2. 10 mm probable left uterine fibroid. 3. No other abnormalities. 4. No ovarian torsion. Electronically Signed   By: Amie Portlandavid  Ormond M.D.   On: 02/25/2016 14:50   [place] Art/ven Flow Abd Pelv Doppler  02/25/2016  CLINICAL DATA:  Pelvic pain since December. Low back and left leg pain and left leg numbness. History of endometriosis. EXAM: TRANSABDOMINAL AND TRANSVAGINAL ULTRASOUND OF PELVIS DOPPLER ULTRASOUND OF OVARIES TECHNIQUE: Both transabdominal and transvaginal ultrasound examinations of the pelvis were performed. Transabdominal technique was performed for global imaging of the pelvis including uterus, ovaries, adnexal regions, and pelvic cul-de-sac. It was necessary to proceed with endovaginal exam following the transabdominal exam to visualize the endometrium and ovaries to better advantage. Color and duplex Doppler ultrasound was utilized to evaluate blood flow to the ovaries. COMPARISON:  CT, 10/17/2015 FINDINGS: Uterus Measurements: 8.0 x 3.0 x 4.8 cm. Probable small, 10 mm, fibroid along the posterior left upper uterine segment. No other masses. Endometrium Thickness: 9 mm. Small amount of fluid in the endometrial canal. No endometrial mass. Right ovary Measurements: 3.6 x 1.9 x 3.2 cm.  Normal appearance/no adnexal mass. Left ovary Measurements: 8.0 x 5.7 x 8.4 cm. Ovary is enlarged by complex cystic mass measuring 8 cm by 7.5 cm x 5.6 cm. This is likely a hemorrhagic ovarian cyst but is nonspecific. There is no convincing internal blood flow within this. Normal arterial and venous blood flow seen in the presumed ovarian tissue draped along the margin of the cyst. No adnexal masses. Pulsed Doppler evaluation of both ovaries demonstrates normal low-resistance arterial and venous waveforms. Other findings No abnormal free fluid. IMPRESSION: 1. 8 cm complex left ovarian cystic mass, which is likely a hemorrhagic ovarian cyst although could reflect a cystic ovarian neoplasm. This was evident as a 6.1 cm cyst on the prior CT. Consider further evaluation and characterization of this mass with pelvic MRI with and without contrast. 2. 10 mm probable left uterine fibroid. 3. No other abnormalities. 4. No ovarian torsion. Electronically Signed   By: Amie Portlandavid  Ormond M.D.   On: 02/25/2016 14:50    MAU COURSE CBC, UA, pelvic US, wet prep, GC/Chlamydia, percocet.   Pain improved. Flagyl for Trich.  Discussed w/ Dr. Billy Coastaavon. May D/C w/ F/U this week in office w/ Dr. Juliene PinaMody. Rx Percocet.   MDM - 30 year-old female w/ 8 cm left complex ovarian cysts, no evidence of torsion. Non-appearing.   ASSESSMENT 1. Complex cyst of left ovary   2. LLQ pain   3. Trichomonas infection   4. Abnormal uterine bleeding     PLAN Discharge home in stable condition per consult w/ Dr. Billy Coastaavon. Torsion Precautions GC/Chlamydia pending Rx Percocet.      Follow-up Information    Follow up with MODY,VAISHALI R, MD. Schedule an appointment as soon as possible for a visit in 1 week.   Specialty:  Obstetrics and Gynecology   Why:  Tell them that  Dr. Billy Coast wants you to be seen next week by Dr. Juliene Pina.    Contact information:   [address] [place] [address] [phone]       Follow up with [facility].   Why:  As needed if symptoms worsen   Contact information:   [address] [id number] [place] [address] [phone]       Medication List    STOP taking these medications        ALEVE 220 MG tablet  Generic drug:  naproxen sodium      TAKE these medications        acetaminophen 500 MG tablet  Commonly known as:  TYLENOL  Take 1,000 mg by mouth every 6 (six) hours as needed for moderate pain. For headache.     Ibuprofen 200 MG Caps  Commonly known as:  MIDOL  Take 4 capsules (800 mg total) by mouth every 8 (eight) hours as needed (severe pain).     oxyCODONE-acetaminophen 5-325 MG tablet  Commonly known as:  PERCOCET/ROXICET  Take 1-2 tablets by mouth every 6 (six) hours as needed for severe pain.         [place], [place] 02/25/2016  3:49 PM

## 2016-02-26 ENCOUNTER — Other Ambulatory Visit: Payer: Self-pay | Admitting: Obstetrics & Gynecology

## 2016-02-26 LAB — GC/CHLAMYDIA PROBE AMP (~~LOC~~) NOT AT ARMC
Chlamydia: NEGATIVE
Neisseria Gonorrhea: NEGATIVE

## 2016-03-30 ENCOUNTER — Encounter: Payer: Self-pay | Admitting: Gynecologic Oncology

## 2016-03-30 ENCOUNTER — Ambulatory Visit: Payer: BLUE CROSS/BLUE SHIELD | Attending: Gynecologic Oncology | Admitting: Gynecologic Oncology

## 2016-03-30 VITALS — BP 120/82 | HR 86 | Temp 98.6°F | Resp 18 | Ht 67.5 in | Wt 383.8 lb

## 2016-03-30 DIAGNOSIS — Z6841 Body Mass Index (BMI) 40.0 and over, adult: Secondary | ICD-10-CM | POA: Diagnosis not present

## 2016-03-30 DIAGNOSIS — N83209 Unspecified ovarian cyst, unspecified side: Secondary | ICD-10-CM | POA: Diagnosis not present

## 2016-03-30 DIAGNOSIS — R971 Elevated cancer antigen 125 [CA 125]: Secondary | ICD-10-CM | POA: Diagnosis not present

## 2016-03-30 DIAGNOSIS — N83202 Unspecified ovarian cyst, left side: Secondary | ICD-10-CM | POA: Diagnosis not present

## 2016-03-30 DIAGNOSIS — Z8249 Family history of ischemic heart disease and other diseases of the circulatory system: Secondary | ICD-10-CM | POA: Insufficient documentation

## 2016-03-30 DIAGNOSIS — D649 Anemia, unspecified: Secondary | ICD-10-CM | POA: Diagnosis not present

## 2016-03-30 DIAGNOSIS — F1221 Cannabis dependence, in remission: Secondary | ICD-10-CM | POA: Insufficient documentation

## 2016-03-30 DIAGNOSIS — N809 Endometriosis, unspecified: Secondary | ICD-10-CM | POA: Insufficient documentation

## 2016-03-30 DIAGNOSIS — Z833 Family history of diabetes mellitus: Secondary | ICD-10-CM | POA: Insufficient documentation

## 2016-03-30 DIAGNOSIS — Z8619 Personal history of other infectious and parasitic diseases: Secondary | ICD-10-CM | POA: Insufficient documentation

## 2016-03-30 DIAGNOSIS — Z809 Family history of malignant neoplasm, unspecified: Secondary | ICD-10-CM | POA: Insufficient documentation

## 2016-03-30 NOTE — Patient Instructions (Signed)
Plan to follow up in November with Dr. Andrey Farmerossi.  Please call for any questions or concerns.

## 2016-04-01 ENCOUNTER — Encounter: Payer: Self-pay | Admitting: Gynecologic Oncology

## 2016-04-01 NOTE — Progress Notes (Signed)
Consult Note: Gyn-Onc  Consult was requested by Dr. Juliene Watkins for the evaluation of Kathleen Watkins 30 y.o. female  CC:  Chief Complaint  Patient presents with  . Ovarian Cyst    New patient  . elevated CA 125 level    Assessment/Plan:  Ms. Kathleen Watkins  is a 30 y.o.  year old with morbid obesity (BMI 59) and an 8cm left ovarian cyst. It is associated with an elevated CA 125 (109).  I have reviewed the images from her ultrasound on 02/25/16. I have evaluated her CT scan from December 2016.  There has been subtle (2cm) growth in the cyst, however, this lesion in a young (29) woman is most likely benign. Given that it is mildly symptomatic, it should be removed at some point, however, given the low suspicion for malignancy and the high risk for deep pelvic surgery in a patient with her body habitus, she should first undergo methods to lose weight before ovarian cystectomy is attempted.  I will contact Dr Kathleen Watkins at [facility] and reassure him that it is safe and appropriate to proceed with bariatric surgery first in Kathleen Watkins. I would plan on seeing her back in approximately 6 months and consider her for surgery to remove the left ovary at that time if she has lost weight and her surgical risk for deep pelvic surgery is optimized.   HPI: Kathleen Watkins is a 30 year old woman who is seen in consultation at the request of Dr Kathleen Watkins for a left ovarian complex mass and elevated CA 125.  The patient reports a history of LLQ pain. This was present in December 2016 which prompted a CT scan of the abdo/pelvis which demonstrated a 6.1cm ovarian cyst on the left.  She had continued pain after birth control insertion and underwent repeat US on 02/25/16 which revealed a Uterus measuring 8 x 3 x 4.8 cm with a 9 mm endometrial stripe. The right ovary measured 3.6 x 1.9 x 3.2 cm. The left ovary measured 8 x 5.7 x 8.4 cm and contained a complex cystic mass measuring 8 x 7.5 x 5.6 cm which was likely a  hemorrhagic ovarian cyst but is nonspecific. There is no convincing internal blood flow within the cyst. The there is normal arteriovenous blood flow in the periphery. In follow-up to that ultrasound scan a CA-125 was drawn on 03/03/2016 and was elevated at 109.6.  The patient has extreme morbid obesity with a BMI 59 kg/m. She's been worked up at [facility] for a gastric sleeve procedure. She was scheduled to undergo this in June 2017 however when her ultrasound CA-125 result came back her bariatric surgeon, Dr. Lily Watkins, decided it was best to cancel her bariatric surgery in favor of a GYN procedure.  The patient is otherwise fairly healthy despite her extreme obesity. She's had 2 prior cesarean sections. She also has endometriosis which is managed with a implantable progestin device.   Current Meds:  Outpatient Encounter Prescriptions as of 03/30/2016  Medication Sig  . Ibuprofen (MIDOL) 200 MG CAPS Take 4 capsules (800 mg total) by mouth every 8 (eight) hours as needed (severe pain).  . methocarbamol (ROBAXIN-750) 750 MG tablet Take 750 mg by mouth.  . oxyCODONE-acetaminophen (PERCOCET/ROXICET) 5-325 MG tablet Take 1-2 tablets by mouth every 6 (six) hours as needed for severe pain.  Marland Kitchen acetaminophen (TYLENOL) 500 MG tablet Take 1,000 mg by mouth every 6 (six) hours as needed for moderate pain. Reported on 03/30/2016   No  facility-administered encounter medications on file as of 03/30/2016.    Allergy: No Known Allergies  Social Hx:   Social History   Social History  . Marital Status: Single    Spouse Name: N/A  . Number of Children: N/A  . Years of Education: N/A   Occupational History  . Not on file.   Social History Main Topics  . Smoking status: Never Smoker   . Smokeless tobacco: Never Used  . Alcohol Use: No     Comment: occasional  . Drug Use: No     Comment: History of marijuana use in past, none in 3 months  . Sexual Activity: Not Currently    Birth Control/ Protection:  Implant, None   Other Topics Concern  . Not on file   Social History Narrative    Past Surgical Hx:  Past Surgical History  Procedure Laterality Date  . Uterine ateriography  12/21/2005    bilateral  . Dilation and curettage of uterus  2007  . Cesarean section    . Hernia repair    . Cystectomy    . Tonsillectomy    . Adenoidectomy    . Tooth extraction    . Cesarean section  08/10/2011    Procedure: CESAREAN SECTION;  Surgeon: Kathleen Newcomer, MD;  Location: [facility];  Service: Gynecology;  Laterality: N/A;    Past Medical Hx:  Past Medical History  Diagnosis Date  . STD (female)     Trich-tx'd in 11/2010,   . Obese   . Anemia     Past Gynecological History:  Endometriosis documented by laparoscopy  No LMP recorded.  Family Hx:  Family History  Problem Relation Age of Onset  . Diabetes Maternal Grandfather   . Hypertension Father   . Cancer Maternal Grandmother     Review of Systems:  Constitutional  Feels well,   ENT Normal appearing ears and nares bilaterally Skin/Breast  No rash, sores, jaundice, itching, dryness Cardiovascular  No chest pain, shortness of breath, or edema  Pulmonary  No cough or wheeze.  Gastro Intestinal  No nausea, vomitting, or diarrhoea. No bright red blood per rectum, + abdominal pain, no change in bowel movement, or constipation.  Genito Urinary  No frequency, urgency, dysuria,  Musculo Skeletal  No myalgia, arthralgia, joint swelling or pain  Neurologic  No weakness, numbness, change in gait,  Psychology  No depression, anxiety, insomnia.   Vitals:  Blood pressure 120/82, pulse 86, temperature 98.6 F (37 C), temperature source Oral, resp. rate 18, height 5' 7.5" (1.715 m), weight 383 lb 12.8 oz (174.091 kg), SpO2 100 %.  Physical Exam: WD in NAD Neck  Supple NROM, without any enlargements.  Lymph Node Survey No cervical supraclavicular or inguinal adenopathy Cardiovascular  Pulse normal rate, regularity and rhythm.  S1 and S2 normal.  Lungs  Clear to auscultation bilateraly, without wheezes/crackles/rhonchi. Good air movement.  Skin  No rash/lesions/breakdown  Psychiatry  Alert and oriented to person, place, and time  Abdomen  Normoactive bowel sounds, abdomen soft, non-tender and obese without evidence of hernia.  Back No CVA tenderness Genito Urinary  Vulva/vagina: Normal external female genitalia.  No lesions. No discharge or bleeding.  Bladder/urethra:  No lesions or masses, well supported bladder  Vagina: normal  Cervix: Normal appearing, no lesions.  Uterus: difficult to accurately discern due to body habitus.   Adnexa: no palpable masses. Rectal  Good tone, no masses no cul de sac nodularity.  Extremities  No bilateral cyanosis,  clubbing or edema.   Quinn Axeossi, Lorece Keach Caroline, MD  04/01/2016, 5:25 PM

## 2016-05-05 DIAGNOSIS — G4733 Obstructive sleep apnea (adult) (pediatric): Secondary | ICD-10-CM | POA: Insufficient documentation

## 2016-05-05 HISTORY — PX: LAPAROSCOPIC GASTRIC SLEEVE RESECTION: SHX5895

## 2016-08-12 ENCOUNTER — Emergency Department (HOSPITAL_BASED_OUTPATIENT_CLINIC_OR_DEPARTMENT_OTHER)
Admission: EM | Admit: 2016-08-12 | Discharge: 2016-08-12 | Disposition: A | Discharge status: Home / Self Care | Payer: Medicaid Other | Attending: Emergency Medicine | Admitting: Emergency Medicine

## 2016-08-12 ENCOUNTER — Emergency Department (HOSPITAL_BASED_OUTPATIENT_CLINIC_OR_DEPARTMENT_OTHER): Payer: Medicaid Other

## 2016-08-12 ENCOUNTER — Encounter (HOSPITAL_BASED_OUTPATIENT_CLINIC_OR_DEPARTMENT_OTHER): Payer: Self-pay | Admitting: *Deleted

## 2016-08-12 DIAGNOSIS — J40 Bronchitis, not specified as acute or chronic: Secondary | ICD-10-CM | POA: Diagnosis not present

## 2016-08-12 DIAGNOSIS — R05 Cough: Secondary | ICD-10-CM | POA: Diagnosis present

## 2016-08-12 MED ORDER — ALBUTEROL SULFATE HFA 108 (90 BASE) MCG/ACT IN AERS: 2.0000 | INHALATION_SPRAY | RESPIRATORY_TRACT | 0 refills | Status: DC | PRN

## 2016-08-12 MED ORDER — AZITHROMYCIN 250 MG PO TABS: 250.0000 mg | ORAL_TABLET | Freq: Every day | ORAL | 0 refills | Status: DC

## 2016-08-12 MED ORDER — SODIUM CHLORIDE 0.9 % IV SOLN
Freq: Once | INTRAVENOUS | Status: AC
  Administered 2016-08-12: 15:00:00 via INTRAVENOUS

## 2016-08-12 MED FILL — PROVENTIL HFA 90 MCG INH: 108 (90 BAS | 17 days supply | Qty: 7 | Fill #0

## 2016-08-12 MED FILL — AZITHROMYCIN 250 MG TABLET: 250 | 5 days supply | Qty: 6 | Fill #0

## 2016-08-12 NOTE — ED Provider Notes (Signed)
CSN: [id number]     Arrival date & time 08/12/16  1346 History   None    Chief Complaint  Patient presents with  . URI   (Consider location/radiation/quality/duration/timing/severity/associated sxs/prior Treatment) The history is provided by the patient. No language interpreter was used.  URI  Presenting symptoms: congestion and cough   Severity:  Moderate Onset quality:  Gradual Duration:  3 days Timing:  Constant Progression:  Worsening Chronicity:  New Relieved by:  Certain positions Worsened by:  Nothing Ineffective treatments:  None tried Associated symptoms: sinus pain   Risk factors: recent illness   Pt complains of a cough and congestion.  Pt thinks she is dehydrated because her throat hurts when she trys to drink   Past Medical History:  Diagnosis Date  . Anemia   . Obese   . STD (female)    Trich-tx'd in 11/2010,    Past Surgical History:  Procedure Laterality Date  . ADENOIDECTOMY    . CESAREAN SECTION    . CESAREAN SECTION  08/10/2011   Procedure: CESAREAN SECTION;  Surgeon: Tereso NewcomerUgonna A Anyanwu, MD;  Location: [facility];  Service: Gynecology;  Laterality: N/A;  . CYSTECTOMY    . DILATION AND CURETTAGE OF UTERUS  2007  . HERNIA REPAIR    . LAPAROSCOPIC GASTRIC BAND REMOVAL WITH LAPAROSCOPIC GASTRIC SLEEVE RESECTION    . TONSILLECTOMY    . TOOTH EXTRACTION    . uterine ateriography  12/21/2005   bilateral   Family History  Problem Relation Age of Onset  . Diabetes Maternal Grandfather   . Hypertension Father   . Cancer Maternal Grandmother    Social History  Substance Use Topics  . Smoking status: Never Smoker  . Smokeless tobacco: Never Used  . Alcohol use No     Comment: occasional   OB History    Gravida Para Term Preterm AB Living   3 2 2  0 1 2   SAB TAB Ectopic Multiple Live Births   0 1 0 0 2     Review of Systems  HENT: Positive for congestion.   Respiratory: Positive for cough.   All other systems reviewed and are  negative.   Allergies  Review of patient's allergies indicates no known allergies.  Home Medications   Prior to Admission medications   Medication Sig Start Date End Date Taking? Authorizing Provider  acetaminophen (TYLENOL) 500 MG tablet Take 1,000 mg by mouth every 6 (six) hours as needed for moderate pain. Reported on 03/30/2016    Historical Provider, MD  albuterol (PROVENTIL HFA;VENTOLIN HFA) 108 (90 Base) MCG/ACT inhaler Inhale 2 puffs into the lungs every 4 (four) hours as needed for wheezing or shortness of breath. 08/12/16   Elson AreasLeslie K Sofia, PA-C  azithromycin (ZITHROMAX) 250 MG tablet Take 1 tablet (250 mg total) by mouth daily. Take first 2 tablets together, then 1 every day until finished. 08/12/16   Elson AreasLeslie K Sofia, PA-C  Ibuprofen (MIDOL) 200 MG CAPS Take 4 capsules (800 mg total) by mouth every 8 (eight) hours as needed (severe pain). 02/25/16   Dorathy KinsmanVirginia Smith, CNM  methocarbamol (ROBAXIN-750) 750 MG tablet Take 750 mg by mouth. 03/29/16   Historical Provider, MD  oxyCODONE-acetaminophen (PERCOCET/ROXICET) 5-325 MG tablet Take 1-2 tablets by mouth every 6 (six) hours as needed for severe pain. 02/25/16   Dorathy KinsmanVirginia Smith, CNM   Meds Ordered and Administered this Visit   Medications  0.9 %  sodium chloride infusion ( Intravenous Stopped 08/12/16 1636)  BP 116/68   Pulse 91   Temp 98.4 F (36.9 C) (Oral)   Resp 18   Ht 5' 7.5" (1.715 m)   Wt (!) 330 lb (149.7 kg)   SpO2 99%   BMI 50.92 kg/m  No data found.   Physical Exam  Constitutional: She appears well-developed and well-nourished.  HENT:  Head: Normocephalic and atraumatic.  Eyes: EOM are normal. Pupils are equal, round, and reactive to light.  Neck: Normal range of motion.  Cardiovascular: Normal rate.   Pulmonary/Chest: Effort normal.  Neurological: She is alert.  Skin: Skin is warm.  Psychiatric: She has a normal mood and affect.  Nursing note and vitals reviewed.   Urgent Care Course   Clinical Course     Procedures (including critical care time)  Labs Review Labs Reviewed - No data to display  Imaging Review Dg Chest 2 View  Result Date: 08/12/2016 CLINICAL DATA:  Cough and chest congestion over 5 days. EXAM: CHEST  2 VIEW COMPARISON:  11/17/2015 FINDINGS: Heart size is normal. Mediastinal shadows are normal. The lungs are clear. No objective bronchial thickening. No effusions. No bony abnormalities. IMPRESSION: Normal chest Electronically Signed   By: Paulina Fusi M.D.   On: 08/12/2016 14:24     Visual Acuity Review  Right Eye Distance:   Left Eye Distance:   Bilateral Distance:    Right Eye Near:   Left Eye Near:    Bilateral Near:         MDM  Pt given iv fluids,  Pt feels better.      1. Bronchitis     Meds ordered this encounter  Medications  . 0.9 %  sodium chloride infusion  . azithromycin (ZITHROMAX) 250 MG tablet    Sig: Take 1 tablet (250 mg total) by mouth daily. Take first 2 tablets together, then 1 every day until finished.    Dispense:  6 tablet    Refill:  0    Order Specific Question:   Supervising Provider    Answer:   MILLER, BRIAN [[id number]]  . albuterol (PROVENTIL HFA;VENTOLIN HFA) 108 (90 Base) MCG/ACT inhaler    Sig: Inhale 2 puffs into the lungs every 4 (four) hours as needed for wheezing or shortness of breath.    Dispense:  1 Inhaler    Refill:  0    Order Specific Question:   Supervising Provider    Answer:   Eber Hong [[id number]]  An After Visit Summary was printed and given to the patient.   Lonia Skinner [place], PA-C 08/12/16 1744    Nelva Nay, MD 08/13/16 [phone]

## 2016-08-12 NOTE — ED Triage Notes (Signed)
Pt c/o URI aymtposm x 3 days, c/o upper back pain , green sputum

## 2016-08-12 NOTE — ED Notes (Signed)
Unsuccessful IV attempt x 2 in L and R AC.

## 2016-08-31 ENCOUNTER — Ambulatory Visit: Payer: BLUE CROSS/BLUE SHIELD | Admitting: Gynecologic Oncology

## 2016-11-23 ENCOUNTER — Encounter (HOSPITAL_BASED_OUTPATIENT_CLINIC_OR_DEPARTMENT_OTHER): Payer: Self-pay | Admitting: *Deleted

## 2016-11-23 ENCOUNTER — Emergency Department (HOSPITAL_BASED_OUTPATIENT_CLINIC_OR_DEPARTMENT_OTHER)
Admission: EM | Admit: 2016-11-23 | Discharge: 2016-11-23 | Disposition: A | Discharge status: Home / Self Care | Payer: Medicaid Other | Attending: Emergency Medicine | Admitting: Emergency Medicine

## 2016-11-23 ENCOUNTER — Emergency Department (HOSPITAL_BASED_OUTPATIENT_CLINIC_OR_DEPARTMENT_OTHER): Payer: Medicaid Other

## 2016-11-23 DIAGNOSIS — Z79899 Other long term (current) drug therapy: Secondary | ICD-10-CM | POA: Diagnosis not present

## 2016-11-23 DIAGNOSIS — R630 Anorexia: Secondary | ICD-10-CM | POA: Diagnosis not present

## 2016-11-23 DIAGNOSIS — R1011 Right upper quadrant pain: Secondary | ICD-10-CM | POA: Diagnosis present

## 2016-11-23 DIAGNOSIS — R112 Nausea with vomiting, unspecified: Secondary | ICD-10-CM | POA: Insufficient documentation

## 2016-11-23 LAB — COMPREHENSIVE METABOLIC PANEL
ALBUMIN: 3.7 g/dL (ref 3.5–5.0)
ALK PHOS: 83 U/L (ref 38–126)
ALT: 11 U/L — ABNORMAL LOW (ref 14–54)
ANION GAP: 7 (ref 5–15)
AST: 16 U/L (ref 15–41)
BUN: 9 mg/dL (ref 6–20)
CALCIUM: 9 mg/dL (ref 8.9–10.3)
CHLORIDE: 106 mmol/L (ref 101–111)
CO2: 26 mmol/L (ref 22–32)
Creatinine, Ser: 0.69 mg/dL (ref 0.44–1.00)
GFR calc Af Amer: >60 mL/min (ref >60)
GFR calc non Af Amer: >60 mL/min (ref >60)
GLUCOSE: 91 mg/dL (ref 65–99)
POTASSIUM: 3.3 mmol/L — AB (ref 3.5–5.1)
SODIUM: 139 mmol/L (ref 135–145)
Total Bilirubin: 0.4 mg/dL (ref 0.3–1.2)
Total Protein: 7.5 g/dL (ref 6.5–8.1)

## 2016-11-23 LAB — URINALYSIS, ROUTINE W REFLEX MICROSCOPIC
GLUCOSE, UA: NEGATIVE mg/dL
HGB URINE DIPSTICK: NEGATIVE
Ketones, ur: 15 mg/dL — AB
LEUKOCYTES UA: NEGATIVE
Nitrite: NEGATIVE
Protein, ur: NEGATIVE mg/dL
SPECIFIC GRAVITY, URINE: 1.028 (ref 1.005–1.030)
pH: 6 (ref 5.0–8.0)

## 2016-11-23 LAB — CBC
HEMATOCRIT: 33.1 % — AB (ref 36.0–46.0)
HEMOGLOBIN: 12 g/dL (ref 12.0–15.0)
MCH: 26.1 pg (ref 26.0–34.0)
MCHC: 36.3 g/dL — AB (ref 30.0–36.0)
MCV: 72.1 fL — AB (ref 78.0–100.0)
Platelets: 317 10*3/uL (ref 150–400)
RBC: 4.59 MIL/uL (ref 3.87–5.11)
RDW: 16.4 % — ABNORMAL HIGH (ref 11.5–15.5)
WBC: 4.9 10*3/uL (ref 4.0–10.5)

## 2016-11-23 LAB — LIPASE, BLOOD: Lipase: 21 U/L (ref 11–51)

## 2016-11-23 LAB — PREGNANCY, URINE: Preg Test, Ur: NEGATIVE

## 2016-11-23 MED ORDER — ONDANSETRON HCL 4 MG/2ML IJ SOLN
4.0000 mg | Freq: Once | INTRAMUSCULAR | Status: AC
  Administered 2016-11-23: 4 mg via INTRAVENOUS
  Filled 2016-11-23: qty 2

## 2016-11-23 MED ORDER — HYDROMORPHONE HCL 1 MG/ML IJ SOLN
1.0000 mg | Freq: Once | INTRAMUSCULAR | Status: AC
  Administered 2016-11-23: 1 mg via INTRAVENOUS
  Filled 2016-11-23: qty 1

## 2016-11-23 MED ORDER — HYDROCODONE-ACETAMINOPHEN 5-325 MG PO TABS: 1.0000 | ORAL_TABLET | Freq: Four times a day (QID) | ORAL | 0 refills | Status: DC | PRN

## 2016-11-23 MED ORDER — MORPHINE SULFATE (PF) 4 MG/ML IV SOLN
4.0000 mg | Freq: Once | INTRAVENOUS | Status: AC
  Administered 2016-11-23: 4 mg via INTRAVENOUS
  Filled 2016-11-23: qty 1

## 2016-11-23 MED ORDER — ONDANSETRON 4 MG PO TBDP: 4.0000 mg | ORAL_TABLET | Freq: Three times a day (TID) | ORAL | 0 refills | Status: DC | PRN

## 2016-11-23 NOTE — ED Notes (Signed)
Pt tolerating PO fluids

## 2016-11-23 NOTE — Discharge Instructions (Signed)
Please Schedule appointment with general surgery regarding today's visit today. Eat nonfat diet from now on. See attached low-fat diet regulations. Take Vicodin as needed for pain.  Get help right away if: Your pain does not go away as soon as your health care provider told you to expect. You cannot stop throwing up. Your pain is only in areas of the abdomen, such as the right side or the left lower portion of the abdomen. You have bloody or black stools, or stools that look like tar. You have severe pain, cramping, or bloating in your abdomen. You have signs of dehydration, such as: Dark urine, very little urine, or no urine. Cracked lips. Dry mouth. Sunken eyes. Sleepiness. Weakness.

## 2016-11-23 NOTE — ED Notes (Signed)
Pt still in US

## 2016-11-23 NOTE — ED Notes (Signed)
Pt taken to ultrasound prior to IV start/lab draw

## 2016-11-23 NOTE — ED Triage Notes (Signed)
Pt states she has had this pain intermittently, current episode started on thursday. Had gastric sleeve surgery in July. Reports she has constant nausea, worse when she attempts to eat. Increased gas.

## 2016-11-23 NOTE — ED Notes (Signed)
Pt given po fluids. 

## 2016-11-23 NOTE — ED Triage Notes (Signed)
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 Also she has had "sleeve" surgery. Reports right side abd pain that is worse when she walks and she has been nauseated

## 2016-11-23 NOTE — ED Notes (Signed)
Patient transported to Ultrasound 

## 2016-11-23 NOTE — ED Notes (Signed)
Pt given Rx x 2 at discharge. Pt ambulatory without need for assist

## 2016-11-24 NOTE — ED Provider Notes (Signed)
[facility]-EMER[facility]ENCY DEPT Provider Note   CSN: [id number] Arrival date & time: 11/23/16  0810     History   Chief Complaint Chief Complaint  Patient presents with  . Abdominal Pain    HPI LESSIE FUNDERBURKE is a 31 y.o. female   The history is provided by the patient. No language interpreter was used.  Abdominal Pain   This is a new problem. Episode onset: 5 days ago. The problem occurs constantly. The problem has been gradually worsening. The pain is associated with an unknown factor. The pain is located in the RUQ. The quality of the pain is sharp and shooting. The pain is at a severity of 10/10. Associated symptoms include nausea and vomiting. Pertinent negatives include fever, diarrhea, constipation, dysuria and hematuria. Associated symptoms comments: No chest pain. No shortness of breath. . The symptoms are aggravated by eating and activity. Nothing relieves the symptoms. Past medical history comments: Recent gastic sleeve in July. .    Past Medical History:  Diagnosis Date  . Anemia   . Obese   . STD (female)    Trich-tx'd in 11/2010,     Patient Active Problem List   Diagnosis Date Noted  . Menorrhagia 09/23/2011  . Pedal edema 09/23/2011  . 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 PPH (postpartum hemorrhage) 07/02/2011  . Morbid obesity (HCC) 06/24/2011  . History of gestational diabetes 06/24/2011  . 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 ADENOIDECTOMY    . CESAREAN SECTION    . CESAREAN SECTION  08/10/2011   Procedure: CESAREAN SECTION;  Surgeon: Tereso Newcomer, MD;  Location: [facility];  Service: Gynecology;  Laterality: N/A;  . CYSTECTOMY    . DILATION AND CURETTAGE OF UTERUS  2007  . HERNIA REPAIR    . LAPAROSCOPIC GASTRIC BAND REMOVAL WITH LAPAROSCOPIC GASTRIC SLEEVE RESECTION    . TONSILLECTOMY    . TOOTH EXTRACTION    . uterine ateriography  12/21/2005   bilateral    OB History      Gravida Para Term Preterm AB Living   3 2 2  0 1 2   SAB TAB Ectopic Multiple Live Births   0 1 0 0 2       Home Medications    Prior to Admission medications   Medication Sig Start Date End Date Taking? Authorizing Provider  acetaminophen (TYLENOL) 500 MG tablet Take 1,000 mg by mouth every 6 (six) hours as needed for moderate pain. Reported on 03/30/2016    Historical Provider, MD  albuterol (PROVENTIL HFA;VENTOLIN HFA) 108 (90 Base) MCG/ACT inhaler Inhale 2 puffs into the lungs every 4 (four) hours as needed for wheezing or shortness of breath. 08/12/16   Elson Areas, PA-C  azithromycin (ZITHROMAX) 250 MG tablet Take 1 tablet (250 mg total) by mouth daily. Take first 2 tablets together, then 1 every day until finished. 08/12/16   Elson Areas, PA-C  HYDROcodone-acetaminophen (NORCO/VICODIN) 5-325 MG tablet Take 1-2 tablets by mouth every 6 (six) hours as needed. 11/23/16   Genasis Zingale Manuel [place], [place]  Ibuprofen (MIDOL) 200 MG CAPS Take 4 capsules (800 mg total) by mouth every 8 (eight) hours as needed (severe pain). 02/25/16   Dorathy Kinsman, CNM  methocarbamol (ROBAXIN-750) 750 MG tablet Take 750 mg by mouth. 03/29/16   Historical Provider, MD  ondansetron (ZOFRAN ODT) 4 MG disintegrating tablet Take 1 tablet (4 mg total) by mouth every 8 (eight) hours  as needed for nausea or vomiting. 11/23/16   Lucita LoraFrancisco Manuel [place], [place]  oxyCODONE-acetaminophen (PERCOCET/ROXICET) 5-325 MG tablet Take 1-2 tablets by mouth every 6 (six) hours as needed for severe pain. 02/25/16   Dorathy KinsmanVirginia Smith, CNM    Family History Family History  Problem Relation Age of Onset  . Diabetes Maternal Grandfather   . Hypertension Father   . Cancer Maternal Grandmother     Social History Social History  Substance Use Topics  . Smoking status: Never Smoker  . Smokeless tobacco: Never Used  . Alcohol use Yes     Comment: occasional     Allergies   Patient has no known allergies.   Review of Systems Review  of Systems  Constitutional: Positive for appetite change. Negative for chills and fever.  Respiratory: Negative for cough and shortness of breath.   Cardiovascular: Negative for chest pain.  Gastrointestinal: Positive for abdominal pain (RUQ), nausea and vomiting. Negative for constipation and diarrhea.  Genitourinary: Negative for difficulty urinating, dysuria and hematuria.  Musculoskeletal: Negative for neck pain and neck stiffness.  Skin: Negative for rash and wound.  All other systems reviewed and are negative.    Physical Exam Updated Vital Signs BP 106/65 (BP Location: Left Arm)   Pulse 80   Temp 98.1 F (36.7 C) (Oral)   Resp 18   Ht 5' 7.5" (1.715 m)   Wt 134.3 kg   LMP 11/01/2016 (Approximate)   SpO2 98%   BMI 45.68 kg/m   Physical Exam  Constitutional: She is oriented to person, place, and time. She appears well-developed and well-nourished.  Well-appearing  HENT:  Head: Normocephalic and atraumatic.  Mouth/Throat: Oropharynx is clear and moist.  Eyes: EOM are normal. Pupils are equal, round, and reactive to light.  Neck: Normal range of motion.  Cardiovascular: Normal rate and normal heart sounds.   Pulmonary/Chest: Effort normal and breath sounds normal.  Abdominal: Soft. There is tenderness (RUQ). There is guarding. There is no rebound.  Abdomen is soft. Right upper quadrant tender with guarding. Otherwise nontender on abdomen. No focal tenderness abdomen is point. No CVA tenderness  Neurological: She is alert and oriented to person, place, and time.  Skin: Skin is warm. Capillary refill takes less than 2 seconds.  Psychiatric: She has a normal mood and affect. Her behavior is normal.  Nursing note and vitals reviewed.    ED Treatments / Results  Labs (all labs ordered are listed, but only abnormal results are displayed) Labs Reviewed  COMPREHENSIVE METABOLIC PANEL - Abnormal; Notable for the following:       Result Value   Potassium 3.3 (*)    ALT 11  (*)    All other components within normal limits  CBC - Abnormal; Notable for the following:    HCT 33.1 (*)    MCV 72.1 (*)    MCHC 36.3 (*)    RDW 16.4 (*)    All other components within normal limits  URINALYSIS, ROUTINE W REFLEX MICROSCOPIC - Abnormal; Notable for the following:    Color, Urine AMBER (*)    APPearance CLOUDY (*)    Bilirubin Urine SMALL (*)    Ketones, ur 15 (*)    All other components within normal limits  LIPASE, BLOOD  PREGNANCY, URINE    EKG  EKG Interpretation None       Radiology [place] Abdomen Limited Ruq  Result Date: 11/23/2016 CLINICAL DATA:  Right upper quadrant pain for several days EXAM: US ABDOMEN LIMITED -  RIGHT UPPER QUADRANT COMPARISON:  03/29/2016 FINDINGS: Gallbladder: Gallbladder is well distended. A tiny echogenic focus is noted which may represent a small stone. Some gallbladder sludge is noted as well. No wall thickening or pericholecystic fluid is noted. Common bile duct: Diameter: 1.2 mm. Liver: Small echogenic lesion measuring 1.5 cm in greatest dimension is noted in the posterior aspect of the right lobe of the liver. This likely represents a small hemangioma. IMPRESSION: Tiny gallstone. Small hepatic hemangioma. Electronically Signed   By: Alcide Clever M.D.   On: 11/23/2016 10:18    Procedures Procedures (including critical care time)  Medications Ordered in ED Medications  morphine 4 MG/ML injection 4 mg (4 mg Intravenous Given 11/23/16 1039)  ondansetron (ZOFRAN) injection 4 mg (4 mg Intravenous Given 11/23/16 1036)  HYDROmorphone (DILAUDID) injection 1 mg (1 mg Intravenous Given 11/23/16 1200)     Initial Impression / Assessment and Plan / ED Course  I have reviewed the triage vital signs and the nursing notes.  Pertinent labs & imaging results that were available during my care of the patient were reviewed by me and considered in my medical decision making (see chart for details).    Patient is a 31 yo female presents with  RUQ abdominal pain x 5 days. On exam, nontoxic, nonseptic appearing, in no apparent distress. Heart and lung sounds clear. Abdomen soft. Murphy sign positive. No focal tenderness at McBurney's point. Initial concern for biliary etiology, pancreatitis, kidney stone or  gastric origin. Low suspicion of appendicitis, bowel obstruction, bowel perforation, diverticulitis, PID or ectopic pregnancy.  Patient's pain and other symptoms adequately managed in emergency department.  Labs, imaging and vitals reviewed. Lab work is reassuring. Ultrasound shows one small gallstone that may not be causing her pain however it does show some biliary sludge which may be causing her biliary colic. Her LFTs and total bilirubin are within normal limits low suspicion for a gallstone lodged in common bile duct at this time. Patient in no apparent distress, hemodynamically stable, afebrile. Patient able to tolerate by mouth challenge prior to discharge.  Patient discharged home with symptomatic treatment and given strict instructions for follow-up with her primary care physician and her general surgeon in 2 days regarding today's visit. I have also discussed reasons to return immediately to the ER.  Patient expresses understanding and agrees with plan.  Final Clinical Impressions(s) / ED Diagnoses   Final diagnoses:  RUQ pain    New Prescriptions Discharge Medication List as of 11/23/2016 11:55 AM    START taking these medications   Details  HYDROcodone-acetaminophen (NORCO/VICODIN) 5-325 MG tablet Take 1-2 tablets by mouth every 6 (six) hours as needed., Starting Mon 11/23/2016, [address] [place], [place] 11/24/16 2304    Arby Barrette, MD 12/16/16 [phone]

## 2017-04-09 ENCOUNTER — Emergency Department (HOSPITAL_BASED_OUTPATIENT_CLINIC_OR_DEPARTMENT_OTHER): Payer: Medicaid Other

## 2017-04-09 ENCOUNTER — Emergency Department (HOSPITAL_BASED_OUTPATIENT_CLINIC_OR_DEPARTMENT_OTHER)
Admission: EM | Admit: 2017-04-09 | Discharge: 2017-04-09 | Disposition: A | Discharge status: Home / Self Care | Payer: Medicaid Other | Attending: Physician Assistant | Admitting: Physician Assistant

## 2017-04-09 ENCOUNTER — Encounter (HOSPITAL_BASED_OUTPATIENT_CLINIC_OR_DEPARTMENT_OTHER): Payer: Self-pay | Admitting: Emergency Medicine

## 2017-04-09 DIAGNOSIS — Z79899 Other long term (current) drug therapy: Secondary | ICD-10-CM | POA: Insufficient documentation

## 2017-04-09 DIAGNOSIS — J069 Acute upper respiratory infection, unspecified: Secondary | ICD-10-CM

## 2017-04-09 DIAGNOSIS — B9789 Other viral agents as the cause of diseases classified elsewhere: Secondary | ICD-10-CM

## 2017-04-09 DIAGNOSIS — R070 Pain in throat: Secondary | ICD-10-CM | POA: Diagnosis present

## 2017-04-09 LAB — RAPID STREP SCREEN (MED CTR MEBANE ONLY): Streptococcus, Group A Screen (Direct): NEGATIVE

## 2017-04-09 MED ORDER — SODIUM CHLORIDE 0.9 % IV BOLUS (SEPSIS)
1000.0000 mL | Freq: Once | INTRAVENOUS | Status: AC
  Administered 2017-04-09: 1000 mL via INTRAVENOUS

## 2017-04-09 MED ORDER — PROCHLORPERAZINE EDISYLATE 5 MG/ML IJ SOLN
10.0000 mg | Freq: Once | INTRAMUSCULAR | Status: AC
  Administered 2017-04-09: 10 mg via INTRAVENOUS
  Filled 2017-04-09: qty 2

## 2017-04-09 MED ORDER — KETOROLAC TROMETHAMINE 30 MG/ML IJ SOLN
30.0000 mg | Freq: Once | INTRAMUSCULAR | Status: AC
  Administered 2017-04-09: 30 mg via INTRAVENOUS
  Filled 2017-04-09: qty 1

## 2017-04-09 MED ORDER — PROMETHAZINE-DM 6.25-15 MG/5ML PO SYRP: 5.0000 mL | ORAL_SOLUTION | Freq: Four times a day (QID) | ORAL | 0 refills | Status: DC | PRN

## 2017-04-09 MED ORDER — ACETAMINOPHEN 500 MG PO TABS
1000.0000 mg | ORAL_TABLET | Freq: Once | ORAL | Status: AC
  Administered 2017-04-09: 1000 mg via ORAL
  Filled 2017-04-09: qty 2

## 2017-04-09 MED ORDER — GUAIFENESIN ER 1200 MG PO TB12: 1.0000 | ORAL_TABLET | Freq: Two times a day (BID) | ORAL | 0 refills | Status: DC

## 2017-04-09 MED ORDER — IBUPROFEN 800 MG PO TABS: 800.0000 mg | ORAL_TABLET | Freq: Three times a day (TID) | ORAL | 0 refills | Status: DC | PRN

## 2017-04-09 MED FILL — IBUPROFEN 800 MG TABLET: 800 | 7 days supply | Qty: 21 | Fill #0

## 2017-04-09 MED FILL — PROMETHAZINE-DM SYRUP: 6.25-15 | 6 days supply | Qty: 120 | Fill #0

## 2017-04-09 NOTE — ED Notes (Signed)
Ambulated to BR.

## 2017-04-09 NOTE — ED Provider Notes (Signed)
[facility] Provider Note   CSN: [id number] Arrival date & time: 04/09/17  4098     History   Chief Complaint Chief Complaint  Patient presents with  . Sore Throat  . Headache    HPI Kathleen Watkins is a 31 y.o. female.  HPI Patient presents to the emergency department with sore throat with headache and cough.  Patient states that this started 7 days ago.  She states that she has had some chills as well.  Patient states that nothing seems make the condition better or worse.  She states she did take Tylenol with out help for relief of her headache.  The patient states she did take TheraFlu without any significant relief. The patient denies chest pain, shortness of breath, headache,blurred vision, neck pain, fever,weakness, numbness, dizziness, anorexia, edema, abdominal pain, nausea, vomiting, diarrhea, rash, back pain, dysuria, hematemesis, bloody stool, near syncope, or syncope. Past Medical History:  Diagnosis Date  . Anemia   . Obese   . STD (female)    Trich-tx'd in 11/2010,     Patient Active Problem List   Diagnosis Date Noted  . Menorrhagia 09/23/2011  . Pedal edema 09/23/2011  . 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 PPH (postpartum hemorrhage) 07/02/2011  . Morbid obesity (HCC) 06/24/2011  . History of gestational diabetes 06/24/2011  . 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 ADENOIDECTOMY    . CESAREAN SECTION    . CESAREAN SECTION  08/10/2011   Procedure: CESAREAN SECTION;  Surgeon: Tereso Newcomer, MD;  Location: [facility];  Service: Gynecology;  Laterality: N/A;  . CYSTECTOMY    . DILATION AND CURETTAGE OF UTERUS  2007  . HERNIA REPAIR    . LAPAROSCOPIC GASTRIC BAND REMOVAL WITH LAPAROSCOPIC GASTRIC SLEEVE RESECTION    . TONSILLECTOMY    . TOOTH EXTRACTION    . uterine ateriography  12/21/2005   bilateral    OB History    Gravida Para Term Preterm AB Living   3  2 2  0 1 2   SAB TAB Ectopic Multiple Live Births   0 1 0 0 2       Home Medications    Prior to Admission medications   Medication Sig Start Date End Date Taking? Authorizing Provider  acetaminophen (TYLENOL) 500 MG tablet Take 1,000 mg by mouth every 6 (six) hours as needed for moderate pain. Reported on 03/30/2016   Yes [provider]  Multiple Vitamin (MULTIVITAMIN WITH MINERALS) TABS tablet Take 1 tablet by mouth daily.   Yes [provider]  albuterol (PROVENTIL HFA;VENTOLIN HFA) 108 (90 Base) MCG/ACT inhaler Inhale 2 puffs into the lungs every 4 (four) hours as needed for wheezing or shortness of breath. 08/12/16   Elson Areas, PA-C  azithromycin (ZITHROMAX) 250 MG tablet Take 1 tablet (250 mg total) by mouth daily. Take first 2 tablets together, then 1 every day until finished. 08/12/16   Elson Areas, PA-C  Guaifenesin 1200 MG TB12 Take 1 tablet (1,200 mg total) by mouth 2 (two) times daily. 04/09/17   Amedio Bowlby, Cristal Deer, PA-C  HYDROcodone-acetaminophen (NORCO/VICODIN) 5-325 MG tablet Take 1-2 tablets by mouth every 6 (six) hours as needed. 11/23/16   Alvina Chou, PA  ibuprofen (ADVIL,MOTRIN) 800 MG tablet Take 1 tablet (800 mg total) by mouth every 8 (eight) hours as needed. 04/09/17   Daisey Caloca, Cristal Deer, PA-C  methocarbamol (ROBAXIN-750) 750 MG tablet Take 750  mg by mouth. 03/29/16   [provider]  ondansetron (ZOFRAN ODT) 4 MG disintegrating tablet Take 1 tablet (4 mg total) by mouth every 8 (eight) hours as needed for nausea or vomiting. 11/23/16   Espina, Lucita LoraFrancisco Manuel, PA  oxyCODONE-acetaminophen (PERCOCET/ROXICET) 5-325 MG tablet Take 1-2 tablets by mouth every 6 (six) hours as needed for severe pain. 02/25/16   Katrinka BlazingSmith, [place], CNM  promethazine-dextromethorphan (PROMETHAZINE-DM) 6.25-15 MG/5ML syrup Take 5 mLs by mouth 4 (four) times daily as needed for cough. 04/09/17   Charlestine NightLawyer, Nneka Blanda, PA-C    Family History Family History    Problem Relation Age of Onset  . Diabetes Maternal Grandfather   . Hypertension Father   . Cancer Maternal Grandmother     Social History Social History  Substance Use Topics  . Smoking status: Never Smoker  . Smokeless tobacco: Never Used  . Alcohol use Yes     Comment: occasional     Allergies   Patient has no known allergies.   Review of Systems Review of Systems All other systems negative except as documented in the HPI. All pertinent positives and negatives as reviewed in the HPI.  Physical Exam Updated Vital Signs BP 103/73 (BP Location: Left Arm)   Pulse 63   Temp 98.1 F (36.7 C) (Oral)   Resp 18   Ht 5\' 7"  (1.702 m)   Wt 134.3 kg (296 lb)   LMP 03/29/2017 (Exact Date)   SpO2 100%   BMI 46.36 kg/m   Physical Exam  Constitutional: She is oriented to person, place, and time. She appears well-developed and well-nourished. No distress.  HENT:  Head: Normocephalic and atraumatic.  Mouth/Throat: Oropharynx is clear and moist.  Eyes: Pupils are equal, round, and reactive to light.  Neck: Trachea normal and normal range of motion. Neck supple. No spinous process tenderness present. No neck rigidity. Normal range of motion present.  Cardiovascular: Normal rate, regular rhythm and normal heart sounds.  Exam reveals no gallop and no friction rub.   No murmur heard. Pulmonary/Chest: Effort normal and breath sounds normal. No respiratory distress. She has no wheezes.  Neurological: She is alert and oriented to person, place, and time. She exhibits normal muscle tone. Coordination normal.  Skin: Skin is warm and dry. Capillary refill takes less than 2 seconds. No rash noted. No erythema.  Psychiatric: She has a normal mood and affect. Her behavior is normal.  Nursing note and vitals reviewed.    ED Treatments / Results  Labs (all labs ordered are listed, but only abnormal results are displayed) Labs Reviewed  RAPID STREP SCREEN (NOT AT [facility])  CULTURE, GROUP A  STREP [facility])    EKG  EKG Interpretation None       Radiology Dg Chest 2 View  Result Date: 04/09/2017 CLINICAL DATA:  Productive cough, sore throat, headache, and chills the past week. Similar symptoms and other family members with ascending diagnosed with strep. EXAM: CHEST  2 VIEW COMPARISON:  Chest x-ray of August 12, 2016 FINDINGS: The lungs are adequately inflated. There is no focal infiltrate. There is no pleural effusion. The heart and pulmonary vascularity are normal. The mediastinum is normal in width. The bony thorax exhibits no acute abnormality. IMPRESSION: There is no pneumonia nor other acute cardiopulmonary abnormality. Electronically Signed   By: David  [place] M.D.   On: 04/09/2017 08:49    Procedures Procedures (including critical care time)  Medications Ordered in ED Medications  acetaminophen (TYLENOL) tablet 1,000 mg (1,000 mg Oral  Given 04/09/17 0842)  sodium chloride 0.9 % bolus 1,000 mL (0 mLs Intravenous Stopped 04/09/17 1106)  ketorolac (TORADOL) 30 MG/ML injection 30 mg (30 mg Intravenous Given 04/09/17 0957)  prochlorperazine (COMPAZINE) injection 10 mg (10 mg Intravenous Given 04/09/17 1112)     Initial Impression / Assessment and Plan / ED Course  I have reviewed the triage vital signs and the nursing notes.  Pertinent labs & imaging results that were available during my care of the patient were reviewed by me and considered in my medical decision making (see chart for details).     She will be treated for viral URI with cough.  Told to return here as needed.  Patient agrees the plan and all questions were answered.  Her headache has improved following IV fluids and medications.    Final Clinical Impressions(s) / ED Diagnoses   Final diagnoses:  Viral URI with cough    New Prescriptions Discharge Medication List as of 04/09/2017 11:52 AM    START taking these medications   Details  Guaifenesin 1200 MG TB12 Take 1 tablet (1,200 mg total) by  mouth 2 (two) times daily., Starting Fri 04/09/2017, Print    ibuprofen (ADVIL,MOTRIN) 800 MG tablet Take 1 tablet (800 mg total) by mouth every 8 (eight) hours as needed., Starting Fri 04/09/2017, Print    promethazine-dextromethorphan (PROMETHAZINE-DM) 6.25-15 MG/5ML syrup Take 5 mLs by mouth 4 (four) times daily as needed for cough., Starting Fri 04/09/2017, Print         Wilfrid Hyser, [place], PA-C 04/09/17 1653    [place]ack[place], Cindee Salt, MD 04/10/17 [phone]

## 2017-04-09 NOTE — Discharge Instructions (Signed)
Return here as needed.  Follow-up with your primary doctor, increase your fluid intake and rest as much possible.  The strep test and x-ray did not show any abnormality

## 2017-04-09 NOTE — ED Triage Notes (Addendum)
Cough, productive, green mucus with sore throat, h/a , chills x 1 week. States children have same symptoms and son was dx with strep on Monday

## 2017-04-11 LAB — CULTURE, GROUP A STREP (THRC)

## 2017-05-24 ENCOUNTER — Inpatient Hospital Stay (HOSPITAL_COMMUNITY)
Admission: AD | Admit: 2017-05-24 | Discharge: 2017-05-24 | Disposition: A | Discharge status: Home / Self Care | Payer: Medicaid Other | Source: Ambulatory Visit | Attending: Obstetrics & Gynecology | Admitting: Obstetrics & Gynecology

## 2017-05-24 ENCOUNTER — Encounter (HOSPITAL_COMMUNITY): Payer: Self-pay | Admitting: *Deleted

## 2017-05-24 ENCOUNTER — Inpatient Hospital Stay (HOSPITAL_COMMUNITY): Payer: Medicaid Other

## 2017-05-24 DIAGNOSIS — N80129 Deep endometriosis of ovary, unspecified ovary: Secondary | ICD-10-CM

## 2017-05-24 DIAGNOSIS — Z79899 Other long term (current) drug therapy: Secondary | ICD-10-CM | POA: Insufficient documentation

## 2017-05-24 DIAGNOSIS — N809 Endometriosis, unspecified: Secondary | ICD-10-CM | POA: Insufficient documentation

## 2017-05-24 DIAGNOSIS — R102 Pelvic and perineal pain: Secondary | ICD-10-CM | POA: Insufficient documentation

## 2017-05-24 LAB — URINALYSIS, ROUTINE W REFLEX MICROSCOPIC
Bilirubin Urine: NEGATIVE
Glucose, UA: NEGATIVE mg/dL
Hgb urine dipstick: NEGATIVE
KETONES UR: NEGATIVE mg/dL
Leukocytes, UA: NEGATIVE
NITRITE: NEGATIVE
PH: 6 (ref 5.0–8.0)
PROTEIN: NEGATIVE mg/dL
Specific Gravity, Urine: 1.026 (ref 1.005–1.030)

## 2017-05-24 LAB — COMPREHENSIVE METABOLIC PANEL
ALBUMIN: 3.6 g/dL (ref 3.5–5.0)
ALT: 8 U/L — AB (ref 14–54)
AST: 17 U/L (ref 15–41)
Alkaline Phosphatase: 84 U/L (ref 38–126)
Anion gap: 5 (ref 5–15)
BUN: 14 mg/dL (ref 6–20)
CHLORIDE: 105 mmol/L (ref 101–111)
CO2: 28 mmol/L (ref 22–32)
CREATININE: 0.78 mg/dL (ref 0.44–1.00)
Calcium: 8.7 mg/dL — ABNORMAL LOW (ref 8.9–10.3)
GFR calc Af Amer: >60 mL/min (ref >60)
GFR calc non Af Amer: >60 mL/min (ref >60)
Glucose, Bld: 96 mg/dL (ref 65–99)
POTASSIUM: 4 mmol/L (ref 3.5–5.1)
SODIUM: 138 mmol/L (ref 135–145)
Total Bilirubin: 0.5 mg/dL (ref 0.3–1.2)
Total Protein: 7.3 g/dL (ref 6.5–8.1)

## 2017-05-24 LAB — CBC
HCT: 31 % — ABNORMAL LOW (ref 36.0–46.0)
Hemoglobin: 10.9 g/dL — ABNORMAL LOW (ref 12.0–15.0)
MCH: 25.7 pg — ABNORMAL LOW (ref 26.0–34.0)
MCHC: 35.2 g/dL (ref 30.0–36.0)
MCV: 73.1 fL — ABNORMAL LOW (ref 78.0–100.0)
Platelets: 250 10*3/uL (ref 150–400)
RBC: 4.24 MIL/uL (ref 3.87–5.11)
RDW: 16.4 % — AB (ref 11.5–15.5)
WBC: 5.1 10*3/uL (ref 4.0–10.5)

## 2017-05-24 LAB — LIPASE, BLOOD: Lipase: 24 U/L (ref 11–51)

## 2017-05-24 LAB — POCT PREGNANCY, URINE: PREG TEST UR: NEGATIVE

## 2017-05-24 LAB — AMYLASE: Amylase: 69 U/L (ref 28–100)

## 2017-05-24 MED ORDER — NAPROXEN 500 MG PO TABS: 500.0000 mg | ORAL_TABLET | Freq: Two times a day (BID) | ORAL | 2 refills | Status: DC

## 2017-05-24 MED ORDER — HYDROCODONE-ACETAMINOPHEN 5-325 MG PO TABS: 1.0000 | ORAL_TABLET | ORAL | 0 refills | Status: DC | PRN

## 2017-05-24 MED ORDER — KETOROLAC TROMETHAMINE 60 MG/2ML IM SOLN
60.0000 mg | Freq: Once | INTRAMUSCULAR | Status: AC
  Administered 2017-05-24: 60 mg via INTRAMUSCULAR
  Filled 2017-05-24: qty 2

## 2017-05-24 NOTE — MAU Note (Signed)
Pt reports that she had been diagnosed with endometrioma last year. Pt having irregular bleeding, not bleeding today. Pt has Nexplanon which has been in for about a year.  Did see Doctor mody, but now is on medicaid instead of [employer]. She is requesting a physician for follow up. Pt also reports history of hernia. Pt reports that she has some intermittent numbness in her legs. Pt reports pain with bowel movements. Pt denies pain w/ urination, but does report frequency.

## 2017-05-24 NOTE — MAU Note (Signed)
States has an endometrioma; "size of her hand" Also has a hernia Has been any ongoing issue for past year States pain has gotten worse Lower abdominal right side Constant Rating pain 10/10 Sharp and cramping in nature Been taking Midol and tylenol extra strength with no relief  Numbness in hands and legs at times

## 2017-05-24 NOTE — MAU Provider Note (Signed)
History     CSN: [id number]  Arrival date and time: 05/24/17 1637   First Provider Initiated Contact with Patient 05/24/17 1747      Chief Complaint  Patient presents with  . Pelvic Pain   Pelvic Pain  The patient's primary symptoms include pelvic pain. The patient's pertinent negatives include no vaginal discharge. This is a new problem. Episode onset: 3-4 days ago. The problem occurs constantly. The problem has been gradually worsening. Pain severity now: 10/10. The problem affects both sides. She is not pregnant. Pertinent negatives include no chills, dysuria, fever, frequency, nausea or vomiting. The vaginal discharge was normal. There has been no bleeding. Nothing aggravates the symptoms. She has tried acetaminophen (midol) for the symptoms. The treatment provided no relief. Contraceptive use: nexplanon. Her menstrual history has been irregular (LMP: about 2-2.5 weeks ago. ). Her past medical history is significant for endometriosis and menorrhagia.   Past Medical History:  Diagnosis Date  . Anemia   . Obese   . STD (female)    Trich-tx'd in 11/2010,     Past Surgical History:  Procedure Laterality Date  . ADENOIDECTOMY    . CESAREAN SECTION    . CESAREAN SECTION  08/10/2011   Procedure: CESAREAN SECTION;  Surgeon: Tereso Newcomer, MD;  Location: [facility];  Service: Gynecology;  Laterality: N/A;  . CYSTECTOMY    . DILATION AND CURETTAGE OF UTERUS  2007  . HERNIA REPAIR    . LAPAROSCOPIC GASTRIC BAND REMOVAL WITH LAPAROSCOPIC GASTRIC SLEEVE RESECTION    . TONSILLECTOMY    . TOOTH EXTRACTION    . uterine ateriography  12/21/2005   bilateral    Family History  Problem Relation Age of Onset  . Diabetes Maternal Grandfather   . Hypertension Father   . Cancer Maternal Grandmother     Social History  Substance Use Topics  . Smoking status: Never Smoker  . Smokeless tobacco: Never Used  . Alcohol use Yes     Comment: occasional    Allergies: No Known  Allergies  Prescriptions Prior to Admission  Medication Sig Dispense Refill Last Dose  . acetaminophen (TYLENOL) 500 MG tablet Take 1,000 mg by mouth every 6 (six) hours as needed for moderate pain. Reported on 03/30/2016   05/24/2017 at Unknown time  . Multiple Vitamin (MULTIVITAMIN WITH MINERALS) TABS tablet Take 1 tablet by mouth daily.   05/24/2017 at Unknown time  . albuterol (PROVENTIL HFA;VENTOLIN HFA) 108 (90 Base) MCG/ACT inhaler Inhale 2 puffs into the lungs every 4 (four) hours as needed for wheezing or shortness of breath. 1 Inhaler 0 More than a month at Unknown time  . azithromycin (ZITHROMAX) 250 MG tablet Take 1 tablet (250 mg total) by mouth daily. Take first 2 tablets together, then 1 every day until finished. 6 tablet 0   . Guaifenesin 1200 MG TB12 Take 1 tablet (1,200 mg total) by mouth 2 (two) times daily. 20 each 0   . HYDROcodone-acetaminophen (NORCO/VICODIN) 5-325 MG tablet Take 1-2 tablets by mouth every 6 (six) hours as needed. 10 tablet 0 More than a month at Unknown time  . ibuprofen (ADVIL,MOTRIN) 800 MG tablet Take 1 tablet (800 mg total) by mouth every 8 (eight) hours as needed. 21 tablet 0 More than a month at Unknown time  . methocarbamol (ROBAXIN-750) 750 MG tablet Take 750 mg by mouth.   Taking  . ondansetron (ZOFRAN ODT) 4 MG disintegrating tablet Take 1 tablet (4 mg total) by mouth every 8 (eight)  hours as needed for nausea or vomiting. 20 tablet 0 More than a month at Unknown time  . oxyCODONE-acetaminophen (PERCOCET/ROXICET) 5-325 MG tablet Take 1-2 tablets by mouth every 6 (six) hours as needed for severe pain. 50 tablet 0 More than a month at Unknown time  . promethazine-dextromethorphan (PROMETHAZINE-DM) 6.25-15 MG/5ML syrup Take 5 mLs by mouth 4 (four) times daily as needed for cough. 120 mL 0 More than a month at Unknown time    Review of Systems  Constitutional: Negative for chills and fever.  Gastrointestinal: Negative for nausea and vomiting.   Genitourinary: Positive for menorrhagia and pelvic pain. Negative for dysuria, frequency, vaginal bleeding and vaginal discharge.   Physical Exam   Blood pressure 127/90, pulse 93, temperature 98 F (36.7 C), temperature source Oral, resp. rate 18, weight (!) 307 lb 1.3 oz (139.3 kg), SpO2 100 %.  Physical Exam  Nursing note and vitals reviewed. Constitutional: She is oriented to person, place, and time. She appears well-developed and well-nourished. No distress.  HENT:  Head: Normocephalic.  Cardiovascular: Normal rate.   GI: Soft. There is no tenderness. There is no rebound.  Neurological: She is alert and oriented to person, place, and time.  Skin: Skin is warm and dry.  Psychiatric: She has a normal mood and affect.   Results for orders placed or performed during the hospital encounter of 05/24/17 (from the past 24 hour(s))  Urinalysis, Routine w reflex microscopic     Status: None   Collection Time: 05/24/17  5:11 PM  Result Value Ref Range   Color, Urine YELLOW YELLOW   APPearance CLEAR CLEAR   Specific Gravity, Urine 1.026 1.005 - 1.030   pH 6.0 5.0 - 8.0   Glucose, UA NEGATIVE NEGATIVE mg/dL   Hgb urine dipstick NEGATIVE NEGATIVE   Bilirubin Urine NEGATIVE NEGATIVE   Ketones, ur NEGATIVE NEGATIVE mg/dL   Protein, ur NEGATIVE NEGATIVE mg/dL   Nitrite NEGATIVE NEGATIVE   Leukocytes, UA NEGATIVE NEGATIVE  Pregnancy, urine POC     Status: None   Collection Time: 05/24/17  5:21 PM  Result Value Ref Range   Preg Test, Ur NEGATIVE NEGATIVE  CBC     Status: Abnormal   Collection Time: 05/24/17  6:05 PM  Result Value Ref Range   WBC 5.1 4.0 - 10.5 K/uL   RBC 4.24 3.87 - 5.11 MIL/uL   Hemoglobin 10.9 (L) 12.0 - 15.0 g/dL   HCT 40.931.0 (L) 81.136.0 - 91.446.0 %   MCV 73.1 (L) 78.0 - 100.0 fL   MCH 25.7 (L) 26.0 - 34.0 pg   MCHC 35.2 30.0 - 36.0 g/dL   RDW 78.216.4 (H) 95.611.5 - 21.315.5 %   Platelets 250 150 - 400 K/uL  Comprehensive metabolic panel     Status: Abnormal   Collection Time:  05/24/17  6:05 PM  Result Value Ref Range   Sodium 138 135 - 145 mmol/L   Potassium 4.0 3.5 - 5.1 mmol/L   Chloride 105 101 - 111 mmol/L   CO2 28 22 - 32 mmol/L   Glucose, Bld 96 65 - 99 mg/dL   BUN 14 6 - 20 mg/dL   Creatinine, Ser 0.860.78 0.44 - 1.00 mg/dL   Calcium 8.7 (L) 8.9 - 10.3 mg/dL   Total Protein 7.3 6.5 - 8.1 g/dL   Albumin 3.6 3.5 - 5.0 g/dL   AST 17 15 - 41 U/L   ALT 8 (L) 14 - 54 U/L   Alkaline Phosphatase 84 38 - 126  U/L   Total Bilirubin 0.5 0.3 - 1.2 mg/dL   GFR calc non Af Amer >60 >60 mL/min   GFR calc Af Amer >60 >60 mL/min   Anion gap 5 5 - 15  Amylase     Status: None   Collection Time: 05/24/17  6:05 PM  Result Value Ref Range   Amylase 69 28 - 100 U/L  Lipase, blood     Status: None   Collection Time: 05/24/17  6:05 PM  Result Value Ref Range   Lipase 24 11 - 51 U/L   [place] Transvaginal Non-ob  Result Date: 05/24/2017 CLINICAL DATA:  Pelvic pain EXAM: TRANSABDOMINAL AND TRANSVAGINAL ULTRASOUND OF PELVIS TECHNIQUE: Both transabdominal and transvaginal ultrasound examinations of the pelvis were performed. Transabdominal technique was performed for global imaging of the pelvis including uterus, ovaries, adnexal regions, and pelvic cul-de-sac. It was necessary to proceed with endovaginal exam following the transabdominal exam to visualize the endometrium. COMPARISON:  02/25/2016 FINDINGS: Uterus Measurements: 8.8 x 4.5 x 5.8 cm. No fibroids or other mass visualized. Endometrium Thickness: 8 mm.  No focal abnormality visualized. Right ovary Measurements: 7.4 x 5.7 x 7.0 cm. 5.6 x 4.7 x 6.1 cm complex cystic lesion with peripheral septations, favoring a hemorrhagic cyst. Left ovary Measurements: 7.1 x 6.1 x 7.3 cm. 7.1 x 5.9 x 5.1 cm diffusely hyperechoic lesion, suggesting a hemorrhagic cyst or endometrioma. Notably, an 8.0 x 7.5 x 5.6 cm lesion was present in 2017. Other findings No abnormal free fluid. IMPRESSION: 7.1 cm complex lesion in the left ovary, possibly  reflecting an endometrioma or hemorrhagic cyst. Notably, a similar lesion was present in 2017, which would favor the diagnosis of endometrioma. 6.1 cm complex cystic lesion in the right ovary, favoring a hemorrhagic cyst. However, given the lesion in the left ovary, an additional endometrioma is possible. Consider GYN surgical consultation as clinically warranted. If further imaging characterization is desired, consider pelvic MRI with/ without contrast. Electronically Signed   By: Charline BillsSriyesh  Krishnan M.D.   On: 05/24/2017 19:08   [place] Pelvis Complete  Result Date: 05/24/2017 CLINICAL DATA:  Pelvic pain EXAM: TRANSABDOMINAL AND TRANSVAGINAL ULTRASOUND OF PELVIS TECHNIQUE: Both transabdominal and transvaginal ultrasound examinations of the pelvis were performed. Transabdominal technique was performed for global imaging of the pelvis including uterus, ovaries, adnexal regions, and pelvic cul-de-sac. It was necessary to proceed with endovaginal exam following the transabdominal exam to visualize the endometrium. COMPARISON:  02/25/2016 FINDINGS: Uterus Measurements: 8.8 x 4.5 x 5.8 cm. No fibroids or other mass visualized. Endometrium Thickness: 8 mm.  No focal abnormality visualized. Right ovary Measurements: 7.4 x 5.7 x 7.0 cm. 5.6 x 4.7 x 6.1 cm complex cystic lesion with peripheral septations, favoring a hemorrhagic cyst. Left ovary Measurements: 7.1 x 6.1 x 7.3 cm. 7.1 x 5.9 x 5.1 cm diffusely hyperechoic lesion, suggesting a hemorrhagic cyst or endometrioma. Notably, an 8.0 x 7.5 x 5.6 cm lesion was present in 2017. Other findings No abnormal free fluid. IMPRESSION: 7.1 cm complex lesion in the left ovary, possibly reflecting an endometrioma or hemorrhagic cyst. Notably, a similar lesion was present in 2017, which would favor the diagnosis of endometrioma. 6.1 cm complex cystic lesion in the right ovary, favoring a hemorrhagic cyst. However, given the lesion in the left ovary, an additional endometrioma is  possible. Consider GYN surgical consultation as clinically warranted. If further imaging characterization is desired, consider pelvic MRI with/ without contrast. Electronically Signed   By: Charline BillsSriyesh  Krishnan M.D.   On:  05/24/2017 19:08   MAU Course  Procedures  MDM Patient has had toradol, and her pain is improved  Assessment and Plan   1. Endometrioma   2. 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   Specialty:  Obstetrics and Gynecology Why:  They will call you with an appointment  Contact information: [address] [place] [address] [phone]           Thressa Sheller 05/24/2017, 5:51 PM

## 2017-05-24 NOTE — Discharge Instructions (Signed)
Endometriosis Endometriosis is a condition in which the tissue that lines the uterus (endometrium) grows outside of its normal location. The tissue may grow in many locations close to the uterus, but it commonly grows on the ovaries, fallopian tubes, vagina, or bowel. When the uterus sheds the endometrium every menstrual cycle, there is bleeding wherever the endometrial tissue is located. This can cause pain because blood is irritating to tissues that are not normally exposed to it. What are the causes? The cause of endometriosis is not known. What increases the risk? You may be more likely to develop endometriosis if you:  Have a family history of endometriosis.  Have never given birth.  Started your period at age 10 or younger.  Have high levels of estrogen in your body.  Were exposed to a certain medicine (diethylstilbestrol) before you were born (in utero).  Had low birth weight.  Were born as a twin, triplet, or other multiple.  Have a BMI of less than 25. BMI is an estimate of body fat and is calculated from height and weight.  What are the signs or symptoms? Often, there are no symptoms of this condition. If you do have symptoms, they may:  Vary depending on where your endometrial tissue is growing.  Occur during your menstrual period (most common) or midcycle.  Come and go, or you may go months with no symptoms at all.  Stop with menopause.  Symptoms may include:  Pain in the back or abdomen.  Heavier bleeding during periods.  Pain during sex.  Painful bowel movements.  Infertility.  Pelvic pain.  Bleeding more than once a month.  How is this diagnosed? This condition is diagnosed based on your symptoms and a physical exam. You may have tests, such as:  Blood tests and urine tests. These may be done to help rule out other possible causes of your symptoms.  Ultrasound, to look for abnormal tissues.  An X-ray of the lower bowel (barium enema).  An  ultrasound that is done through the vagina (transvaginally).  CT scan.  MRI.  Laparoscopy. In this procedure, a lighted, pencil-sized instrument called a laparoscope is inserted into your abdomen through an incision. The laparoscope allows your health care provider to look at the organs inside your body and check for abnormal tissue to confirm the diagnosis. If abnormal tissue is found, your health care provider may remove a small piece of tissue (biopsy) to be examined under a microscope.  How is this treated? Treatment for this condition may include:  Medicines to relieve pain, such as NSAIDs.  Hormone therapy. This involves using artificial (synthetic) hormones to reduce endometrial tissue growth. Your health care provider may recommend using a hormonal form of birth control, or other medicines.  Surgery. This may be done to remove abnormal endometrial tissue. ? In some cases, tissue may be removed using a laparoscope and a laser (laparoscopic laser treatment). ? In severe cases, surgery may be done to remove the fallopian tubes, uterus, and ovaries (hysterectomy).  Follow these instructions at home:  Take over-the-counter and prescription medicines only as told by your health care provider.  Do not drive or use heavy machinery while taking prescription pain medicine.  Try to avoid activities that cause pain, including sexual activity.  Keep all follow-up visits as told by your health care provider. This is important. Contact a health care provider if:  You have pain in the area between your hip bones (pelvic area) that occurs: ? Before, during, or   after your period. ? In between your period and gets worse during your period. ? During or after sex. ? With bowel movements or urination, especially during your period.  You have problems getting pregnant.  You have a fever. Get help right away if:  You have severe pain that does not get better with medicine.  You have severe  nausea and vomiting, or you cannot eat without vomiting.  You have pain that affects only the lower, right side of your abdomen.  You have abdominal pain that gets worse.  You have abdominal swelling.  You have blood in your stool. This information is not intended to replace advice given to you by your health care provider. Make sure you discuss any questions you have with your health care provider. Document Released: 10/09/2000 Document Revised: 07/17/2016 Document Reviewed: 03/14/2016 Elsevier Interactive Patient Education  2018 Elsevier Inc.  

## 2017-06-01 ENCOUNTER — Encounter: Payer: Self-pay | Admitting: General Practice

## 2017-06-01 ENCOUNTER — Telehealth: Payer: Self-pay | Admitting: General Practice

## 2017-06-01 NOTE — Telephone Encounter (Signed)
Unable to reach patient via phone or leave message to schedule surgical consult.  Will make patient appointment and send letter.

## 2017-06-23 ENCOUNTER — Ambulatory Visit (INDEPENDENT_AMBULATORY_CARE_PROVIDER_SITE_OTHER): Payer: Medicaid Other | Admitting: Family Medicine

## 2017-06-23 ENCOUNTER — Encounter: Payer: Self-pay | Admitting: Family Medicine

## 2017-06-23 DIAGNOSIS — N809 Endometriosis, unspecified: Secondary | ICD-10-CM | POA: Diagnosis not present

## 2017-06-23 DIAGNOSIS — N83201 Unspecified ovarian cyst, right side: Secondary | ICD-10-CM | POA: Diagnosis not present

## 2017-06-23 DIAGNOSIS — N83202 Unspecified ovarian cyst, left side: Secondary | ICD-10-CM

## 2017-06-23 MED ORDER — TRAMADOL HCL 50 MG PO TABS: 50.0000 mg | ORAL_TABLET | Freq: Four times a day (QID) | ORAL | 0 refills | Status: DC | PRN

## 2017-06-23 MED ORDER — LEUPROLIDE ACETATE (3 MONTH) 11.25 MG IM KIT
11.2500 mg | PACK | Freq: Once | INTRAMUSCULAR | Status: AC
  Administered 2017-06-24: 11.25 mg via INTRAMUSCULAR

## 2017-06-23 NOTE — Progress Notes (Signed)
Obstetrics and Gynecology New Patient Evaluation  Appointment Date: 06/23/2017  OBGYN Clinic: [facility]  Primary Care Provider: Patient, No Pcp Per  Referring Provider: No ref. provider found  Chief Complaint:  Chief Complaint  Patient presents with  . Follow-up    from [facility]U; pelvic pain, cyst on ovary    History of Present Illness: Kathleen Watkins is a 31 y.o. African-American [id number] (Patient's last menstrual period was 05/26/2017 (within days).), referred from [facility] for surgical consultation for bilateral endometriomas. She was recently seen for pelvic pain and ovarian cysts in the MAU on 7/30 and had [place] which revealed bilateral 7cm ovarian cysts concerning for endometriomas. She has extensive history of painful, irregular periods with heavy bleeding, beginning in teenage years. She states that the pain has progressed and she has 10/10 bilateral lower abdominal pain during her periods, which last 2 weeks. She changes 1 pad & 1 tampon per hour on heaviest days. She reports unpredictable episodes of severe pain when not on period. She states that her pain is severely interfering with her daily life. She has tried OCPs, nexplanon, midol, tylenol for symptom control and pain management without relief. Notably, she had an umbilical hernia repair in 2012 with histopathology report revealing endometriosis in the navel. She was also seen by gyn/onc due to elevated CA-125 in the setting of ovarian cyst in 2017, and was determined to have low risk of malignancy.   She states that she no longer desires to have children and desires a hysterectomy if it would alleviate pain.   Her past surgical history is significant for gastric sleeve placement, umbilical hernia repair, C/S x2.   No fevers, chills, chest pain, SOB, nausea, vomiting, dysuria, hematuria, vaginal itching or discharge blood in BMs  Review of Systems: Her 12 point review of systems is negative or as noted in  the History of Present Illness.  Patient Active Problem List   Diagnosis Date Noted  . Endometriosis 06/23/2017  . Bilateral ovarian cysts 06/23/2017  . Menorrhagia 09/23/2011  . Pedal edema 09/23/2011  . 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 PPH (postpartum hemorrhage) 07/02/2011  . Morbid obesity (HCC) 06/24/2011  . History of gestational diabetes 06/24/2011  . 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  Past Medical History:  Past Medical History:  Diagnosis Date  . Anemia   . Obese   . STD (female)    Trich-tx'd in 11/2010,     Past Surgical History:  Past Surgical History:  Procedure Laterality Date  . ADENOIDECTOMY    . CESAREAN SECTION    . CESAREAN SECTION  08/10/2011   Procedure: CESAREAN SECTION;  Surgeon: Tereso Newcomer, MD;  Location: [facility];  Service: Gynecology;  Laterality: N/A;  . CYSTECTOMY    . DILATION AND CURETTAGE OF UTERUS  2007  . HERNIA REPAIR    . LAPAROSCOPIC GASTRIC BAND REMOVAL WITH LAPAROSCOPIC GASTRIC SLEEVE RESECTION    . TONSILLECTOMY    . TOOTH EXTRACTION    . uterine ateriography  12/21/2005   bilateral    Past Obstetrical History:  OB History  Gravida Para Term Preterm AB Living  3 2 2  0 1 2  SAB TAB Ectopic Multiple Live Births  0 1 0 0 2    # Outcome Date  GA Lbr Len/2nd Weight Sex Delivery Anes PTL Lv  3 Term 08/10/11 [redacted]w[redacted]d  7 lb 0.4 oz (3.187 kg) M CS-LTranv Spinal  LIV  2 Term 10/2005 [redacted]w[redacted]d  6 lb 12 oz (3.062 kg) F CS-Unspec EPI N LIV     Birth Comments: GDM  1 TAB               SVD x0, Cesarean section x2  Past Gynecological History: As per HPI. Menarche age 20 Periods: Irregular, heavy, painful History of Pap Smear(s): Yes.   Last pap 92yr prior, which was abnormal  (ASCUS?) History of STI(s): Yes.   She is currently using nexplanon for contraception.   Social History:  Social History   Social History  . Marital status: Single    Spouse name: N/A  . Number of children: N/A  . Years of education: N/A   Occupational History  . Not on file.   Social History Main Topics  . Smoking status: Never Smoker  . Smokeless tobacco: Never Used  . Alcohol use Yes     Comment: occasional  . Drug use: No     Comment: History of marijuana use in past, none in 3 months  . Sexual activity: Not Currently    Birth control/ protection: None, Implant   Other Topics Concern  . Not on file   Social History Narrative  . No narrative on file    Family History:  Family History  Problem Relation Age of Onset  . Diabetes Maternal Grandfather   . Hypertension Father   . Cancer Maternal Grandmother    Maternal GM with breast cancer diagnosed in mid 31s. She denies any female cancers, bleeding or blood clotting disorders.   Medications Kathleen Watkins had no medications administered during this visit. Current Outpatient Prescriptions  Medication Sig Dispense Refill  . acetaminophen (TYLENOL) 500 MG tablet Take 1,000 mg by mouth every 6 (six) hours as needed for moderate pain. Reported on 03/30/2016    . albuterol (PROVENTIL HFA;VENTOLIN HFA) 108 (90 Base) MCG/ACT inhaler Inhale 2 puffs into the lungs every 4 (four) hours as needed for wheezing or shortness of breath. 1 Inhaler 0  . Calcium Carbonate-Vit D-Min (CALCIUM 1200 PO) Take by mouth.    . cyanocobalamin 500 MCG tablet Take 500 mcg by mouth daily.    . Multiple Vitamin (MULTIVITAMIN WITH MINERALS) TABS tablet Take 1 tablet by mouth daily.    . traMADol (ULTRAM) 50 MG tablet Take 1 tablet (50 mg total) by mouth every 6 (six) hours as needed. 60 tablet 0   Current Facility-Administered Medications  Medication Dose Route Frequency Provider Last Rate Last Dose  . leuprolide (LUPRON) injection 11.25 mg  11.25  mg Intramuscular Once Reva Bores, MD        Allergies Patient has no known allergies.   Physical Exam:  BP 104/81   Pulse 79   Ht 5\' 7"  (1.702 m)   Wt 295 lb (133.8 kg)   LMP 05/26/2017 (Within Days)   BMI 46.20 kg/m  Body mass index is 46.2 kg/m. Weight last year: 385 (90lb wt loss s/p gastric sleeve) General appearance: Well nourished, well developed female in no acute distress.  Neck:  Supple, normal appearance, and no thyromegaly  Cardiovascular: normal rate Respiratory: Normal respiratory effort Abdomen: scar visible in umbilicus, multiple scars visible across abdomen, non-tender, non-distended Adnexa: mild bilateral adnexal tenderness Neuro/Psych:  Normal mood and affect.  Skin:  Warm and dry.  Lymphatic:  No inguinal  lymphadenopathy.   Radiology:  Abdominal US: 7.1 cm complex lesion in the left ovary, possibly reflecting an endometrioma or hemorrhagic cyst. Notably, a similar lesion was present in 2017, which would favor the diagnosis of endometrioma. 6.1 cm complex cystic lesion in the right ovary, favoring a hemorrhagic cyst. However, given the lesion in the left ovary, an additional endometrioma is possible.  Assessment: Jillyn Stacey is a 30yoF [id number] with PMH of endometriosis s/p 4 prior abdominal surgeries presenting with severe abdominal pain and bilateral ovarian cysts concerning for endometriomas. Given the size of the cysts and significance of pain, removal of the cysts and uterus would likely be therapeutic. However, her surgical risk is significantly increased by history of multiple abdominal surgeries and body habitus.   Plan: 1. Bilateral Ovarian Cysts - Leuprolide injection for symptomatic relief, pt counseled on side effects and risks of injection - To discuss with Dr. Erin Fulling and decide how to proceed with surgical intervention - Return precautions given regarding new onset or worsening symptoms.   2. Morbid obesity - Pt advised to  continue with weight loss goal  No orders of the defined types were placed in this encounter.  Dannette Barbara, Medical Student

## 2017-06-24 ENCOUNTER — Ambulatory Visit (INDEPENDENT_AMBULATORY_CARE_PROVIDER_SITE_OTHER): Payer: Medicaid Other | Admitting: *Deleted

## 2017-06-24 ENCOUNTER — Telehealth: Payer: Self-pay | Admitting: Family Medicine

## 2017-06-24 VITALS — BP 117/54 | HR 74

## 2017-06-24 DIAGNOSIS — N809 Endometriosis, unspecified: Secondary | ICD-10-CM

## 2017-06-24 NOTE — Progress Notes (Signed)
Presents to clinic for Lupron injection. Patient tolerated well.

## 2017-06-24 NOTE — Telephone Encounter (Signed)
Called patient to inform her we think the safest way to do her surgery is with the robot. Dr. Erin FullingHarraway-Smith will see her in consult prior to surgery. I have already discussed surgery with patient. Will plan on hysterectomy with attempt at ovarian cystectomy, knowing she may have to lose her ovaries.

## 2017-06-24 NOTE — Patient Instructions (Signed)
Hysterectomy Information A hysterectomy is a surgery to remove your uterus. After surgery, you will no longer have periods. Also, you will not be able to get pregnant. Reasons for this surgery  You have bleeding that is not normal and keeps coming back.  You have lasting (chronic) lower belly (pelvic) pain.  You have a lasting infection.  The lining of your uterus grows outside your uterus.  The lining of your uterus grows in the muscle of your uterus.  Your uterus falls down into your vagina.  You have a growth in your uterus that causes problems.  You have cells that could turn into cancer (precancerous cells).  You have cancer of the uterus or cervix. Types There are 3 types of hysterectomies. Depending on the type, the surgery will:  Remove the top part of the uterus only.  Remove the uterus and the cervix.  Remove the uterus, cervix, and tissue that holds the uterus in place in the lower belly.  Ways a hysterectomy can be performed There are 5 ways this surgery can be performed.  A cut (incision) is made in the belly (abdomen). The uterus is taken out through the cut.  A cut is made in the vagina. The uterus is taken out through the cut.  Three or four cuts are made in the belly. A surgical device with a camera is put through one of the cuts. The uterus is cut into small pieces. The uterus is taken out through the cuts or the vagina.  Three or four cuts are made in the belly. A surgical device with a camera is put through one of the cuts. The uterus is taken out through the vagina.  Three or four cuts are made in the belly. A surgical device that is controlled by a computer makes a visual image. The device helps the surgeon control the surgical tools. The uterus is cut into small pieces. The pieces are taken out through the cuts or through the vagina.  What can I expect after the surgery?  You will be given pain medicine.  You will need help at home for 3-5 days  after surgery.  You will need to see your doctor in 2-4 weeks after surgery.  You may get hot flashes, have night sweats, and have trouble sleeping.  You may need to have Pap tests in the future if your surgery was related to cancer. Talk to your doctor. It is still good to have regular exams. This information is not intended to replace advice given to you by your health care provider. Make sure you discuss any questions you have with your health care provider. Document Released: 01/04/2012 Document Revised: 03/19/2016 Document Reviewed: 06/19/2013 Elsevier Interactive Patient Education  2018 Elsevier Inc.  

## 2017-07-05 ENCOUNTER — Encounter (HOSPITAL_COMMUNITY): Payer: Self-pay

## 2017-07-22 ENCOUNTER — Telehealth: Payer: Self-pay | Admitting: *Deleted

## 2017-07-22 NOTE — Telephone Encounter (Signed)
Kathleen Watkins called this am and left a message she had thought she was calling our office and not getting  Calls, but realized she was calling the wrong number. States she has missed some work because of her pain. States is using tramadol but wants to know if she can get a note. I called Kathleen Watkins back and explained I reviewed her chart and her doctor did not take her out of work so I can not give her a note.  We also discussed that she states she thinks the Lupron is helping her pain but she states it feels like when her cycle is trying to come on her pain gets worse- like it did when she came in for her [facility] visit . States the shot they gave her helped her a lot. I instructed her to go to mau since she feels her pain is severe again and she can discuss with them if she can get the toradol again. She voices understanding.

## 2017-07-23 ENCOUNTER — Inpatient Hospital Stay (HOSPITAL_COMMUNITY)
Admission: AD | Admit: 2017-07-23 | Discharge: 2017-07-23 | Disposition: A | Discharge status: Home / Self Care | Payer: Self-pay | Source: Ambulatory Visit | Attending: Obstetrics & Gynecology | Admitting: Obstetrics & Gynecology

## 2017-07-23 ENCOUNTER — Encounter (HOSPITAL_COMMUNITY): Payer: Self-pay

## 2017-07-23 DIAGNOSIS — N83202 Unspecified ovarian cyst, left side: Secondary | ICD-10-CM | POA: Insufficient documentation

## 2017-07-23 DIAGNOSIS — N83201 Unspecified ovarian cyst, right side: Secondary | ICD-10-CM | POA: Insufficient documentation

## 2017-07-23 DIAGNOSIS — N946 Dysmenorrhea, unspecified: Secondary | ICD-10-CM | POA: Insufficient documentation

## 2017-07-23 DIAGNOSIS — E669 Obesity, unspecified: Secondary | ICD-10-CM | POA: Insufficient documentation

## 2017-07-23 DIAGNOSIS — A64 Unspecified sexually transmitted disease: Secondary | ICD-10-CM | POA: Insufficient documentation

## 2017-07-23 DIAGNOSIS — N809 Endometriosis, unspecified: Secondary | ICD-10-CM | POA: Insufficient documentation

## 2017-07-23 DIAGNOSIS — R062 Wheezing: Secondary | ICD-10-CM | POA: Insufficient documentation

## 2017-07-23 DIAGNOSIS — Z79899 Other long term (current) drug therapy: Secondary | ICD-10-CM | POA: Insufficient documentation

## 2017-07-23 DIAGNOSIS — D649 Anemia, unspecified: Secondary | ICD-10-CM | POA: Insufficient documentation

## 2017-07-23 DIAGNOSIS — R102 Pelvic and perineal pain: Secondary | ICD-10-CM | POA: Insufficient documentation

## 2017-07-23 DIAGNOSIS — N80129 Deep endometriosis of ovary, unspecified ovary: Secondary | ICD-10-CM

## 2017-07-23 HISTORY — DX: Endometriosis, unspecified: N80.9

## 2017-07-23 HISTORY — DX: Unspecified ovarian cyst, left side: N83.202

## 2017-07-23 HISTORY — DX: Unspecified ovarian cyst, right side: N83.201

## 2017-07-23 LAB — URINALYSIS, ROUTINE W REFLEX MICROSCOPIC
BILIRUBIN URINE: NEGATIVE
GLUCOSE, UA: NEGATIVE mg/dL
Hgb urine dipstick: NEGATIVE
Ketones, ur: NEGATIVE mg/dL
Leukocytes, UA: NEGATIVE
NITRITE: NEGATIVE
PH: 5 (ref 5.0–8.0)
Protein, ur: NEGATIVE mg/dL
SPECIFIC GRAVITY, URINE: 1.028 (ref 1.005–1.030)

## 2017-07-23 LAB — POCT PREGNANCY, URINE: Preg Test, Ur: NEGATIVE

## 2017-07-23 MED ORDER — KETOROLAC TROMETHAMINE 60 MG/2ML IM SOLN
60.0000 mg | Freq: Once | INTRAMUSCULAR | Status: AC
  Administered 2017-07-23: 60 mg via INTRAMUSCULAR
  Filled 2017-07-23: qty 2

## 2017-07-23 NOTE — MAU Provider Note (Signed)
Chief Complaint: Abdominal Pain   SUBJECTIVE HPI: Kathleen Watkins is a 31 y.o. [id number] who presents to MAU with abdominal pain. Patient has PMH significant for dysmenorrhea secondary to endometriosis. Patient has two known endometriomas for which she has planned surgery on 08/31/17. Patient states that it is her usually time of month for her period and she is having severe abdominal pain. She denies vaginal bleeding and states this is due to the Lupron she takes. However her symptoms persist. Currently rates lower abdominal pain as a 9/10. Called clinic and was told to come to MAU for pain management.    Past Medical History:  Diagnosis Date  . Anemia   . Bilateral ovarian cysts   . Endometriosis   . Obese   . STD (female)    Trich-tx'd in 11/2010,    OB History  Gravida Para Term Preterm AB Living  0 1 2  SAB TAB Ectopic Multiple Live Births  0 1 0 0 2    # Outcome Date GA Lbr Len/2nd Weight Sex Delivery Anes PTL Lv  3 Term 08/10/11 [redacted]w[redacted]d  3.187 kg (7 lb 0.4 oz) M CS-LTranv Spinal  LIV  2 Term 10/2005 [redacted]w[redacted]d  3.062 kg (6 lb 12 oz) F CS-Unspec EPI N LIV     Birth Comments: GDM  1 TAB              Past Surgical History:  Procedure Laterality Date  . ADENOIDECTOMY    . CESAREAN SECTION    . CESAREAN SECTION  08/10/2011   Procedure: CESAREAN SECTION;  Surgeon: Tereso Newcomer, MD;  Location: [facility];  Service: Gynecology;  Laterality: N/A;  . CYSTECTOMY    . DILATION AND CURETTAGE OF UTERUS  2007  . HERNIA REPAIR    . LAPAROSCOPIC GASTRIC BAND REMOVAL WITH LAPAROSCOPIC GASTRIC SLEEVE RESECTION    . TONSILLECTOMY    . TOOTH EXTRACTION    . uterine ateriography  12/21/2005   bilateral   Social History   Social History  . Marital status: Single    Spouse name: N/A  . Number of children: N/A  . Years of education: N/A   Occupational History  . Not on file.   Social History Main Topics  . Smoking status: Never Smoker  . Smokeless tobacco: Never Used  . Alcohol  use Yes     Comment: occasional  . Drug use: No     Comment: History of marijuana use in past, none in 3 months  . Sexual activity: Not Currently    Birth control/ protection: None, Implant   Other Topics Concern  . Not on file   Social History Narrative  . No narrative on file   No current facility-administered medications on file prior to encounter.    Current Outpatient Prescriptions on File Prior to Encounter  Medication Sig Dispense Refill  . acetaminophen (TYLENOL) 500 MG tablet Take 1,000 mg by mouth every 6 (six) hours as needed for moderate pain. Reported on 03/30/2016    . albuterol (PROVENTIL HFA;VENTOLIN HFA) 108 (90 Base) MCG/ACT inhaler Inhale 2 puffs into the lungs every 4 (four) hours as needed for wheezing or shortness of breath. 1 Inhaler 0  . Calcium Carbonate-Vit D-Min (CALCIUM 1200 PO) Take by mouth.    . cyanocobalamin 500 MCG tablet Take 500 mcg by mouth daily.    . Multiple Vitamin (MULTIVITAMIN WITH MINERALS) TABS tablet Take 1 tablet by mouth daily.    . traMADol Janean Sark)  50 MG tablet Take 1 tablet (50 mg total) by mouth every 6 (six) hours as needed. 60 tablet 0   No Known Allergies  I have reviewed the past Medical Hx, Surgical Hx, Social Hx, Allergies and Medications.   REVIEW OF SYSTEMS All systems reviewed and are negative for acute change except as noted in the HPI.   OBJECTIVE BP 127/69 (BP Location: Right Arm)   Pulse 84   Temp 98.2 F (36.8 C) (Oral)   Resp 18   Wt (!) 136.2 kg (300 lb 4 oz)   LMP 06/19/2017   SpO2 100%   BMI 47.03 kg/m    PHYSICAL EXAM Constitutional: Well-developed, well-nourished female in no acute distress.  Head: Normocephalic.  Cardiovascular: normal rate and rhythm, pulses intact Respiratory: normal rate and effort.  GI: Abd soft, non-tender, non-distended, no guarding or rebound MS: Extremities nontender, no edema, normal ROM Neurologic: Alert and oriented x 4. No focal deficits Psychiatric: She has a  normal mood and affect.   LAB RESULTS No results found for this or any previous visit (from the past 24 hour(s)).  IMAGING No results found.  MAU COURSE Vitals and nursing notes reviewed Treatments given in MAU: Toradol  x1 with pain reduction to 4/10  MDM Plan of care reviewed with patient, including labs and tests ordered and medical treatment.   ASSESSMENT 1. Pelvic pain   2. Endometrioma     PLAN Discharge home in stable condition Continue prn home pain medication and other comfort measures Plan for surgery by OBGYN as planned Counseled on return precautions Handout given   Caryl Ada, DO [facility], [facility] -  07/23/2017, 7:07 PM

## 2017-07-23 NOTE — MAU Note (Signed)
Having excruciating pain, has 2 endometriomas.  Called office was regarding pain, was told to come here.  Is scheduled for surgery 11/6.

## 2017-07-23 NOTE — Discharge Instructions (Signed)
Endometriosis Endometriosis is a condition in which the tissue that lines the uterus (endometrium) grows outside of its normal location. The tissue may grow in many locations close to the uterus, but it commonly grows on the ovaries, fallopian tubes, vagina, or bowel. When the uterus sheds the endometrium every menstrual cycle, there is bleeding wherever the endometrial tissue is located. This can cause pain because blood is irritating to tissues that are not normally exposed to it. What are the causes? The cause of endometriosis is not known. What increases the risk? You may be more likely to develop endometriosis if you:  Have a family history of endometriosis.  Have never given birth.  Started your period at age 10 or younger.  Have high levels of estrogen in your body.  Were exposed to a certain medicine (diethylstilbestrol) before you were born (in utero).  Had low birth weight.  Were born as a twin, triplet, or other multiple.  Have a BMI of less than 25. BMI is an estimate of body fat and is calculated from height and weight.  What are the signs or symptoms? Often, there are no symptoms of this condition. If you do have symptoms, they may:  Vary depending on where your endometrial tissue is growing.  Occur during your menstrual period (most common) or midcycle.  Come and go, or you may go months with no symptoms at all.  Stop with menopause.  Symptoms may include:  Pain in the back or abdomen.  Heavier bleeding during periods.  Pain during sex.  Painful bowel movements.  Infertility.  Pelvic pain.  Bleeding more than once a month.  How is this diagnosed? This condition is diagnosed based on your symptoms and a physical exam. You may have tests, such as:  Blood tests and urine tests. These may be done to help rule out other possible causes of your symptoms.  Ultrasound, to look for abnormal tissues.  An X-ray of the lower bowel (barium enema).  An  ultrasound that is done through the vagina (transvaginally).  CT scan.  MRI.  Laparoscopy. In this procedure, a lighted, pencil-sized instrument called a laparoscope is inserted into your abdomen through an incision. The laparoscope allows your health care provider to look at the organs inside your body and check for abnormal tissue to confirm the diagnosis. If abnormal tissue is found, your health care provider may remove a small piece of tissue (biopsy) to be examined under a microscope.  How is this treated? Treatment for this condition may include:  Medicines to relieve pain, such as NSAIDs.  Hormone therapy. This involves using artificial (synthetic) hormones to reduce endometrial tissue growth. Your health care provider may recommend using a hormonal form of birth control, or other medicines.  Surgery. This may be done to remove abnormal endometrial tissue. ? In some cases, tissue may be removed using a laparoscope and a laser (laparoscopic laser treatment). ? In severe cases, surgery may be done to remove the fallopian tubes, uterus, and ovaries (hysterectomy).  Follow these instructions at home:  Take over-the-counter and prescription medicines only as told by your health care provider.  Do not drive or use heavy machinery while taking prescription pain medicine.  Try to avoid activities that cause pain, including sexual activity.  Keep all follow-up visits as told by your health care provider. This is important. Contact a health care provider if:  You have pain in the area between your hip bones (pelvic area) that occurs: ? Before, during, or   after your period. ? In between your period and gets worse during your period. ? During or after sex. ? With bowel movements or urination, especially during your period.  You have problems getting pregnant.  You have a fever. Get help right away if:  You have severe pain that does not get better with medicine.  You have severe  nausea and vomiting, or you cannot eat without vomiting.  You have pain that affects only the lower, right side of your abdomen.  You have abdominal pain that gets worse.  You have abdominal swelling.  You have blood in your stool. This information is not intended to replace advice given to you by your health care provider. Make sure you discuss any questions you have with your health care provider. Document Released: 10/09/2000 Document Revised: 07/17/2016 Document Reviewed: 03/14/2016 Elsevier Interactive Patient Education  2018 Elsevier Inc.  

## 2017-08-17 ENCOUNTER — Ambulatory Visit: Payer: Self-pay | Admitting: Obstetrics & Gynecology

## 2017-08-23 ENCOUNTER — Telehealth: Payer: Self-pay | Admitting: *Deleted

## 2017-08-23 NOTE — Telephone Encounter (Signed)
Attempted to return patient's call. Unable to leave message on machine, has message sated that the patient's mailbox is full.

## 2017-08-24 ENCOUNTER — Telehealth: Payer: Self-pay | Admitting: *Deleted

## 2017-08-24 ENCOUNTER — Encounter (HOSPITAL_BASED_OUTPATIENT_CLINIC_OR_DEPARTMENT_OTHER): Payer: Self-pay | Admitting: Emergency Medicine

## 2017-08-24 ENCOUNTER — Emergency Department (HOSPITAL_BASED_OUTPATIENT_CLINIC_OR_DEPARTMENT_OTHER)
Admission: EM | Admit: 2017-08-24 | Discharge: 2017-08-24 | Disposition: A | Discharge status: Home / Self Care | Payer: Self-pay | Attending: Emergency Medicine | Admitting: Emergency Medicine

## 2017-08-24 DIAGNOSIS — N809 Endometriosis, unspecified: Secondary | ICD-10-CM | POA: Insufficient documentation

## 2017-08-24 DIAGNOSIS — Z79899 Other long term (current) drug therapy: Secondary | ICD-10-CM | POA: Insufficient documentation

## 2017-08-24 MED ORDER — KETOROLAC TROMETHAMINE 60 MG/2ML IM SOLN
60.0000 mg | Freq: Once | INTRAMUSCULAR | Status: AC
  Administered 2017-08-24: 60 mg via INTRAMUSCULAR
  Filled 2017-08-24: qty 2

## 2017-08-24 NOTE — Telephone Encounter (Signed)
Attempted to return call, unable to leave message due to the patient's voicemail being full.

## 2017-08-24 NOTE — ED Provider Notes (Signed)
[facility] EMERGENCY DEPARTMENT Provider Note   CSN: [id number] Arrival date & time: 08/24/17  7829     History   Chief Complaint Chief Complaint  Patient presents with  . Abdominal Pain    HPI Kathleen Watkins is a 31 y.o. female.  HPI   Hx of endometriosis  On lupron, doesn't get menses anymore, but still having cyclical pain for 7 days every month Severe stabbing lower abdominal pain, difficult to sleep at night, started 2 days ago, lasts 2-3 more days at this intensity Taking tramadol. Has hx of weight loss surgery so can't take ibuprofen.  Comes to ED every month with pain, reports she tolerates toradol and that helps with pain.    Past Medical History:  Diagnosis Date  . Anemia   . Bilateral ovarian cysts   . Endometriosis   . Obese   . STD (female)    Trich-tx'd in 11/2010,     Patient Active Problem List   Diagnosis Date Noted  . Endometriosis 06/23/2017  . Bilateral ovarian cysts 06/23/2017  . Menorrhagia 09/23/2011  . Pedal edema 09/23/2011  . 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 PPH (postpartum hemorrhage) 07/02/2011  . Morbid obesity (HCC) 06/24/2011  . History of gestational diabetes 06/24/2011  . 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 ADENOIDECTOMY    . CESAREAN SECTION    . CESAREAN SECTION  08/10/2011   Procedure: CESAREAN SECTION;  Surgeon: Tereso Newcomer, MD;  Location: [facility];  Service: Gynecology;  Laterality: N/A;  . CYSTECTOMY    . DILATION AND CURETTAGE OF UTERUS  2007  . HERNIA REPAIR    . LAPAROSCOPIC GASTRIC BAND REMOVAL WITH LAPAROSCOPIC GASTRIC SLEEVE RESECTION    . TONSILLECTOMY    . TOOTH EXTRACTION    . uterine ateriography  12/21/2005   bilateral     OB History    Gravida Para Term Preterm AB Living   3 2 2  0 1 2   SAB TAB Ectopic Multiple Live Births   0 1 0 0 2       Home Medications    Prior to Admission medications    Medication Sig Start Date End Date Taking? Authorizing Provider  acetaminophen (TYLENOL) 500 MG tablet Take 1,000 mg by mouth every 6 (six) hours as needed for moderate pain. Reported on 03/30/2016    [provider]  albuterol (PROVENTIL HFA;VENTOLIN HFA) 108 (90 Base) MCG/ACT inhaler Inhale 2 puffs into the lungs every 4 (four) hours as needed for wheezing or shortness of breath. 08/12/16   Elson Areas, PA-C  Calcium Carbonate-Vit D-Min (CALCIUM 1200 PO) Take by mouth.    [provider]  cyanocobalamin 500 MCG tablet Take 500 mcg by mouth daily.    [provider]  Multiple Vitamin (MULTIVITAMIN WITH MINERALS) TABS tablet Take 1 tablet by mouth daily.    [provider]  traMADol (ULTRAM) 50 MG tablet Take 1 tablet (50 mg total) by mouth every 6 (six) hours as needed. 06/23/17   Reva Bores, MD    Family History Family History  Problem Relation Age of Onset  . Diabetes Maternal Grandfather   . Hypertension Father   . Cancer Maternal Grandmother     Social History Social History  Substance Use Topics  . Smoking status: Never Smoker  . Smokeless tobacco: Never Used  . Alcohol use Yes     Comment: occasional  Allergies   Patient has no known allergies.   Review of Systems Review of Systems  Constitutional: Negative for fever.  HENT: Negative for sore throat.   Eyes: Negative for visual disturbance.  Respiratory: Negative for cough and shortness of breath.   Cardiovascular: Negative for chest pain.  Gastrointestinal: Negative for abdominal pain.  Genitourinary: Positive for pelvic pain. Negative for difficulty urinating.  Musculoskeletal: Negative for back pain and neck pain.  Skin: Negative for rash.  Neurological: Negative for syncope and headaches.     Physical Exam Updated Vital Signs BP 128/78 (BP Location: Right Arm)   Pulse 85   Temp 98 F (36.7 C) (Oral)   Resp 18   Ht 5' 7.5" (1.715 m)   Wt 133.8 kg (295 lb)    LMP 08/23/2017   SpO2 100%   BMI 45.52 kg/m   Physical Exam  Constitutional: She is oriented to person, place, and time. She appears well-developed and well-nourished. No distress.  HENT:  Head: Normocephalic and atraumatic.  Eyes: Conjunctivae and EOM are normal.  Neck: Normal range of motion.  Cardiovascular: Normal rate, regular rhythm, normal heart sounds and intact distal pulses.  Exam reveals no gallop and no friction rub.   No murmur heard. Pulmonary/Chest: Effort normal and breath sounds normal. No respiratory distress. She has no wheezes. She has no rales.  Abdominal: Soft. She exhibits no distension. There is no tenderness. There is no guarding.  Musculoskeletal: She exhibits no edema or tenderness.  Neurological: She is alert and oriented to person, place, and time.  Skin: Skin is warm and dry. No rash noted. She is not diaphoretic. No erythema.  Nursing note and vitals reviewed.    ED Treatments / Results  Labs (all labs ordered are listed, but only abnormal results are displayed) Labs Reviewed - No data to display  EKG  EKG Interpretation None       Radiology No results found.  Procedures Procedures (including critical care time)  Medications Ordered in ED Medications  ketorolac (TORADOL) injection 60 mg (60 mg Intramuscular Given 08/24/17 0938)     Initial Impression / Assessment and Plan / ED Course  I have reviewed the triage vital signs and the nursing notes.  Pertinent labs & imaging results that were available during my care of the patient were reviewed by me and considered in my medical decision making (see chart for details).     31 year old female with a history of endometriosis presents with abdominal pain.  Reports that the abdominal pain is cyclical and consistent with her endometriosis pain and she is having hysterectomy done and November 6.  Have low suspicion for appendicitis, diverticulitis, cholecystitis, tubo-ovarian abscess or PID  by history and physical exam.  Suspect this is likely pain secondary to her endometriosis.  She was given a shot of Toradol, recommend follow-up with her OB/GYN. Patient discharged in stable condition with understanding of reasons to return.   Final Clinical Impressions(s) / ED Diagnoses   Final diagnoses:  Endometriosis    New Prescriptions Discharge Medication List as of 08/24/2017  9:35 AM       Alvira MondaySchlossman, Fabian Walder, MD 08/25/17 [phone]

## 2017-08-24 NOTE — ED Triage Notes (Signed)
Lower abd cramping, hx of endometriosis and scheduled for hysterectomy on 11/6. Reports excruciating pain during menstrual cycle.

## 2017-08-25 ENCOUNTER — Telehealth: Payer: Self-pay | Admitting: *Deleted

## 2017-08-25 ENCOUNTER — Telehealth (HOSPITAL_COMMUNITY): Payer: Self-pay

## 2017-08-25 NOTE — Telephone Encounter (Signed)
Attempted to return the patient's call, unable to leave a message due the patient's voicemail being full

## 2017-08-25 NOTE — Telephone Encounter (Signed)
Called patient to notify her that her surgery scheduled for 11/06 has been cancelled.  Patient "no show" her pre-surgery counseling appointment in the office.  Unable to speak w/ patient, unable to leave a voicemail, voice mailbox is full.

## 2017-08-30 ENCOUNTER — Telehealth (HOSPITAL_COMMUNITY): Payer: Self-pay

## 2017-08-30 NOTE — Telephone Encounter (Signed)
Called to inform patient, surgery has been cancelled, no answer, unable to leave message, voice mailbox full

## 2017-08-31 ENCOUNTER — Encounter (HOSPITAL_COMMUNITY): Admission: RE | Payer: Self-pay | Source: Ambulatory Visit

## 2017-08-31 ENCOUNTER — Ambulatory Visit (HOSPITAL_COMMUNITY): Admission: RE | Admit: 2017-08-31 | Payer: Self-pay | Source: Ambulatory Visit | Admitting: Obstetrics & Gynecology

## 2017-08-31 SURGERY — ROBOTIC ASSISTED TOTAL HYSTERECTOMY
Anesthesia: Choice | Laterality: Bilateral

## 2017-09-03 ENCOUNTER — Encounter (HOSPITAL_COMMUNITY): Payer: Self-pay

## 2017-09-14 ENCOUNTER — Emergency Department (HOSPITAL_BASED_OUTPATIENT_CLINIC_OR_DEPARTMENT_OTHER)
Admission: EM | Admit: 2017-09-14 | Discharge: 2017-09-14 | Disposition: A | Discharge status: Home / Self Care | Payer: Self-pay | Attending: Emergency Medicine | Admitting: Emergency Medicine

## 2017-09-14 ENCOUNTER — Other Ambulatory Visit: Payer: Self-pay

## 2017-09-14 ENCOUNTER — Encounter (HOSPITAL_BASED_OUTPATIENT_CLINIC_OR_DEPARTMENT_OTHER): Payer: Self-pay | Admitting: *Deleted

## 2017-09-14 DIAGNOSIS — N809 Endometriosis, unspecified: Secondary | ICD-10-CM | POA: Insufficient documentation

## 2017-09-14 DIAGNOSIS — Z79899 Other long term (current) drug therapy: Secondary | ICD-10-CM | POA: Insufficient documentation

## 2017-09-14 MED ORDER — KETOROLAC TROMETHAMINE 60 MG/2ML IM SOLN
60.0000 mg | Freq: Once | INTRAMUSCULAR | Status: AC
  Administered 2017-09-14: 60 mg via INTRAMUSCULAR
  Filled 2017-09-14: qty 2

## 2017-09-14 NOTE — ED Notes (Signed)
ED Provider at bedside. 

## 2017-09-14 NOTE — ED Triage Notes (Signed)
States she has 2 endometrial cyst that are causing her to have abdominal pain. States she needs a pain shot. [facility]

## 2017-09-14 NOTE — ED Provider Notes (Signed)
[facility] EMERGENCY DEPARTMENT Provider Note   CSN: [id number] Arrival date & time: 09/14/17  1210     History   Chief Complaint Chief Complaint  Patient presents with  . Ovarian Cyst    HPI Kathleen PonderLatoya N Coll is a 31 y.o. female.  HPI Pt with hx of gastric bypass comes to the ER with complaints of pelvic pain. She has endometriosis and was initially scheduled for surgery on 11/6 but after insurance problems it has been changed to January. She does not get period bleeding but every month she has 7 days of pain. This pain is not different then previous exacerbations. [facility], she receives a Toradol shot and this really helps. She has not had sex in 1 year, she does not have n/v/d, dysuria, back pain or vaginal discharge or bleeding.   Past Medical History:  Diagnosis Date  . Anemia   . Bilateral ovarian cysts   . Endometriosis   . Obese   . STD (female)    Trich-tx'd in 11/2010,     Patient Active Problem List   Diagnosis Date Noted  . Endometriosis 06/23/2017  . Bilateral ovarian cysts 06/23/2017  . Menorrhagia 09/23/2011  . Pedal edema 09/23/2011  . 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 PPH (postpartum hemorrhage) 07/02/2011  . Morbid obesity (HCC) 06/24/2011  . History of gestational diabetes 06/24/2011  . 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 ADENOIDECTOMY    . CESAREAN SECTION    . CESAREAN SECTION  08/10/2011   Procedure: CESAREAN SECTION;  Surgeon: Tereso NewcomerUgonna A Anyanwu, MD;  Location: [facility];  Service: Gynecology;  Laterality: N/A;  . CYSTECTOMY    . DILATION AND CURETTAGE OF UTERUS  2007  . HERNIA REPAIR    . LAPAROSCOPIC GASTRIC BAND REMOVAL WITH LAPAROSCOPIC GASTRIC SLEEVE RESECTION    . TONSILLECTOMY    . TOOTH EXTRACTION    . uterine ateriography  12/21/2005   bilateral    OB History    Gravida Para Term Preterm AB Living   3  2 2  0 1 2   SAB TAB Ectopic Multiple Live Births   0 1 0 0 2       Home Medications    Prior to Admission medications   Medication Sig Start Date End Date Taking? Authorizing Provider  acetaminophen (TYLENOL) 500 MG tablet Take 1,000 mg by mouth every 6 (six) hours as needed for moderate pain. Reported on 03/30/2016    [provider]  albuterol (PROVENTIL HFA;VENTOLIN HFA) 108 (90 Base) MCG/ACT inhaler Inhale 2 puffs into the lungs every 4 (four) hours as needed for wheezing or shortness of breath. 08/12/16   Elson AreasSofia, Leslie K, PA-C  Calcium Carbonate-Vit D-Min (CALCIUM 1200 PO) Take by mouth.    [provider]  cyanocobalamin 500 MCG tablet Take 500 mcg by mouth daily.    [provider]  Multiple Vitamin (MULTIVITAMIN WITH MINERALS) TABS tablet Take 1 tablet by mouth daily.    [provider]  traMADol (ULTRAM) 50 MG tablet Take 1 tablet (50 mg total) by mouth every 6 (six) hours as needed. 06/23/17   Reva BoresPratt, Tanya S, MD    Family History Family History  Problem Relation Age of Onset  . Diabetes Maternal Grandfather   . Hypertension Father   . Cancer Maternal Grandmother     Social History Social History   Tobacco  Use  . Smoking status: Never Smoker  . Smokeless tobacco: Never Used  Substance Use Topics  . Alcohol use: Yes    Comment: occasional  . Drug use: No    Comment: History of marijuana use in past, none in 3 months     Allergies   Patient has no known allergies.   Review of Systems Review of Systems  The patient denies anorexia, fever, weight loss,, vision loss, decreased hearing, hoarseness, chest pain, syncope, dyspnea on exertion, peripheral edema, balance deficits, hemoptysis, abdominal pain, melena, hematochezia, severe indigestion/heartburn, hematuria, incontinence, genital sores, muscle weakness, suspicious skin lesions, transient blindness, difficulty walking, depression, unusual weight change, abnormal bleeding,  enlarged lymph nodes, angioedema, and breast masses.  Physical Exam Updated Vital Signs BP 122/79   Pulse 84   Temp 98.2 F (36.8 C) (Oral)   Resp 16   Ht 5' 7.5" (1.715 m)   Wt 133.8 kg (295 lb)   LMP 08/23/2017   SpO2 99%   BMI 45.52 kg/m   Physical Exam  Constitutional: She appears well-developed and well-nourished. No distress.  HENT:  Head: Normocephalic and atraumatic.  Eyes: Pupils are equal, round, and reactive to light.  Neck: Normal range of motion. Neck supple.  Cardiovascular: Normal rate and regular rhythm.  Pulmonary/Chest: Effort normal.  Abdominal: Soft.  Neurological: She is alert.  Skin: Skin is warm and dry.  Nursing note and vitals reviewed.    ED Treatments / Results  Labs (all labs ordered are listed, but only abnormal results are displayed) Labs Reviewed - No data to display  EKG  EKG Interpretation None       Radiology No results found.  Procedures Procedures (including critical care time)  Medications Ordered in ED Medications  ketorolac (TORADOL) injection 60 mg (60 mg Intramuscular Given 09/14/17 1240)     Initial Impression / Assessment and Plan / ED Course  I have reviewed the triage vital signs and the nursing notes.  Pertinent labs & imaging results that were available during my care of the patient were reviewed by me and considered in my medical decision making (see chart for details).     Symptoms consistent with her monthly cyclical pain. Surgery date now for January. [facility] and let them know about her [facility]. Doubt appendicitis, diverticulitis, cholecystitis, ovarian infection, PID.  She has been given 60 mg IM Toradol shot.  Blood pressure 122/79, pulse 84, temperature 98.2 F (36.8 C), temperature source Oral, resp. rate 16, height 5' 7.5" (1.715 m), weight 133.8 kg (295 lb), last menstrual period 08/23/2017, SpO2 99 %.  Kathleen Watkins has been evaluated today in the emergency  department. The appropriate screening and testing was been performed and I believe the patient to be medically stable for discharge.   Return signs and symptoms have been discussed with the patient and/or caregivers and they have voiced their understanding. The patient has agreed to follow-up with their primary care provider or the referred specialist.  Final Clinical Impressions(s) / ED Diagnoses   Final diagnoses:  Endometriosis    ED Discharge Orders    None       Marlon PelGreene, [place], PA-C 09/14/17 1244    Loren RacerYelverton, David, MD 09/14/17 1510

## 2017-10-14 ENCOUNTER — Ambulatory Visit: Payer: Self-pay | Admitting: Obstetrics & Gynecology

## 2017-10-21 ENCOUNTER — Encounter (HOSPITAL_COMMUNITY): Payer: Self-pay

## 2017-10-21 ENCOUNTER — Telehealth (HOSPITAL_COMMUNITY): Payer: Self-pay

## 2017-10-21 NOTE — Telephone Encounter (Signed)
Called patient to inform her of missed office visit on 10/14/2017 w/ Dr. Erin FullingHarraway-Smith, and to clarify her plans to have surgery on 11/02/2017. No answer, left a voicemail instructing patient to return my call at the office, advised her that her surgery would be cancelled if I did not hear back from her.  Certified Letter mailed to home address.

## 2017-10-22 ENCOUNTER — Telehealth (HOSPITAL_COMMUNITY): Payer: Self-pay

## 2017-10-22 NOTE — Telephone Encounter (Signed)
Called patient to find out her intentions with her upcoming surgery, no answer, unable to leave voicemail, mailbox is full.  Called [facility] to see if they have had any luck contacting her. Spoke with Tammy who informed me they have called her on 12/12, 12/18 and 12/20 and have also mailed her a letter. Patient has not responded.

## 2017-10-28 ENCOUNTER — Ambulatory Visit: Payer: Self-pay | Admitting: Obstetrics & Gynecology

## 2017-11-02 ENCOUNTER — Encounter (HOSPITAL_COMMUNITY): Payer: Self-pay

## 2017-11-02 ENCOUNTER — Ambulatory Visit (HOSPITAL_COMMUNITY): Admit: 2017-11-02 | Payer: Self-pay | Admitting: Obstetrics & Gynecology

## 2017-11-02 SURGERY — ROBOTIC ASSISTED TOTAL HYSTERECTOMY
Anesthesia: Choice | Laterality: Bilateral

## 2017-11-16 ENCOUNTER — Telehealth (HOSPITAL_COMMUNITY): Payer: Self-pay

## 2017-11-16 NOTE — Telephone Encounter (Signed)
Returned mail [phone]([address] [place] [id number])  Certified letter sent on 10/21/2017.  Returned mail on 11/15/2017.

## 2018-03-04 ENCOUNTER — Encounter (HOSPITAL_BASED_OUTPATIENT_CLINIC_OR_DEPARTMENT_OTHER): Payer: Self-pay | Admitting: *Deleted

## 2018-03-04 ENCOUNTER — Other Ambulatory Visit: Payer: Self-pay

## 2018-03-04 ENCOUNTER — Emergency Department (HOSPITAL_BASED_OUTPATIENT_CLINIC_OR_DEPARTMENT_OTHER)
Admission: EM | Admit: 2018-03-04 | Discharge: 2018-03-04 | Disposition: A | Discharge status: Home / Self Care | Payer: Self-pay | Attending: Emergency Medicine | Admitting: Emergency Medicine

## 2018-03-04 DIAGNOSIS — Z5321 Procedure and treatment not carried out due to patient leaving prior to being seen by health care provider: Secondary | ICD-10-CM | POA: Insufficient documentation

## 2018-03-04 DIAGNOSIS — R1030 Lower abdominal pain, unspecified: Secondary | ICD-10-CM | POA: Insufficient documentation

## 2018-03-04 NOTE — ED Triage Notes (Signed)
Pt c/o lower bil abd cramping x 3 days with cycle HX of same

## 2018-03-15 ENCOUNTER — Emergency Department (HOSPITAL_BASED_OUTPATIENT_CLINIC_OR_DEPARTMENT_OTHER): Payer: Self-pay

## 2018-03-15 ENCOUNTER — Other Ambulatory Visit: Payer: Self-pay

## 2018-03-15 ENCOUNTER — Encounter (HOSPITAL_BASED_OUTPATIENT_CLINIC_OR_DEPARTMENT_OTHER): Payer: Self-pay | Admitting: Emergency Medicine

## 2018-03-15 ENCOUNTER — Emergency Department (HOSPITAL_BASED_OUTPATIENT_CLINIC_OR_DEPARTMENT_OTHER)
Admission: EM | Admit: 2018-03-15 | Discharge: 2018-03-15 | Disposition: A | Discharge status: Home / Self Care | Payer: Self-pay | Attending: Emergency Medicine | Admitting: Emergency Medicine

## 2018-03-15 DIAGNOSIS — R102 Pelvic and perineal pain: Secondary | ICD-10-CM | POA: Insufficient documentation

## 2018-03-15 DIAGNOSIS — Z79899 Other long term (current) drug therapy: Secondary | ICD-10-CM | POA: Insufficient documentation

## 2018-03-15 DIAGNOSIS — Z8742 Personal history of other diseases of the female genital tract: Secondary | ICD-10-CM

## 2018-03-15 LAB — WET PREP, GENITAL
Clue Cells Wet Prep HPF POC: NONE SEEN
Sperm: NONE SEEN
TRICH WET PREP: NONE SEEN
YEAST WET PREP: NONE SEEN

## 2018-03-15 LAB — CBC WITH DIFFERENTIAL/PLATELET
Basophils Absolute: 0 10*3/uL (ref 0.0–0.1)
Basophils Relative: 0 %
EOS PCT: 2 %
Eosinophils Absolute: 0.1 10*3/uL (ref 0.0–0.7)
HCT: 33.1 % — ABNORMAL LOW (ref 36.0–46.0)
HEMOGLOBIN: 12.1 g/dL (ref 12.0–15.0)
LYMPHS ABS: 2.6 10*3/uL (ref 0.7–4.0)
LYMPHS PCT: 55 %
MCH: 26.7 pg (ref 26.0–34.0)
MCHC: 36.6 g/dL — ABNORMAL HIGH (ref 30.0–36.0)
MCV: 73.1 fL — ABNORMAL LOW (ref 78.0–100.0)
MONOS PCT: 11 %
Monocytes Absolute: 0.5 10*3/uL (ref 0.1–1.0)
Neutro Abs: 1.5 10*3/uL — ABNORMAL LOW (ref 1.7–7.7)
Neutrophils Relative %: 32 %
PLATELETS: 281 10*3/uL (ref 150–400)
RBC: 4.53 MIL/uL (ref 3.87–5.11)
RDW: 15.5 % (ref 11.5–15.5)
WBC: 4.8 10*3/uL (ref 4.0–10.5)

## 2018-03-15 LAB — URINALYSIS, ROUTINE W REFLEX MICROSCOPIC
Bilirubin Urine: NEGATIVE
Glucose, UA: NEGATIVE mg/dL
Hgb urine dipstick: NEGATIVE
KETONES UR: 15 mg/dL — AB
Leukocytes, UA: NEGATIVE
NITRITE: NEGATIVE
PROTEIN: NEGATIVE mg/dL
Specific Gravity, Urine: 1.02 (ref 1.005–1.030)
pH: 7.5 (ref 5.0–8.0)

## 2018-03-15 LAB — PREGNANCY, URINE: PREG TEST UR: NEGATIVE

## 2018-03-15 MED ORDER — HYDROCODONE-ACETAMINOPHEN 5-325 MG PO TABS: 1.0000 | ORAL_TABLET | Freq: Four times a day (QID) | ORAL | 0 refills | Status: DC | PRN

## 2018-03-15 MED ORDER — HYDROCODONE-ACETAMINOPHEN 5-325 MG PO TABS
2.0000 | ORAL_TABLET | Freq: Once | ORAL | Status: AC
  Administered 2018-03-15: 2 via ORAL
  Filled 2018-03-15: qty 2

## 2018-03-15 MED FILL — HYDROCODON-APAP 5-325: 5-325 | 2 days supply | Qty: 12 | Fill #0

## 2018-03-15 NOTE — Discharge Instructions (Addendum)
Take Tylenol for mild pain or the pain medicine prescribed for severe pain. Don't take Tylenol together with the pain medicine prescribed as the combination can be dangerous to your liver.  Call the center for women's health care, or the gynecologist of your choice today to arrange for the next available follow-up visit

## 2018-03-15 NOTE — ED Provider Notes (Signed)
[facility] EMERGENCY DEPARTMENT Provider Note   CSN: [id number] Arrival date & time: 03/15/18  0865     History   Chief Complaint Chief Complaint  Patient presents with  . Abdominal Pain    HPI Kathleen Watkins is a 32 y.o. female.complains of low crampy abdominal pain onset 2 weeks ago. Pain is similar to pain that she's hadin the past due to endometriosis. She has been told that she needs a hysterectomy however her insurance ran out. She's been treating herself with Tylenol without relief. She has been treated with tramadol in the past without relief. No nausea no vomiting no fever no other associated symptoms.pain is severe not made better or worse by anything. Last normal menstrual period 02/17/2018  HPI  Past Medical History:  Diagnosis Date  . Anemia   . Bilateral ovarian cysts   . Endometriosis   . Obese   . STD (female)    Trich-tx'd in 11/2010,     Patient Active Problem List   Diagnosis Date Noted  . Endometriosis 06/23/2017  . Bilateral ovarian cysts 06/23/2017  . Menorrhagia 09/23/2011  . Pedal edema 09/23/2011  . 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 PPH (postpartum hemorrhage) 07/02/2011  . Morbid obesity (HCC) 06/24/2011  . History of gestational diabetes 06/24/2011  . 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 ADENOIDECTOMY    . CESAREAN SECTION    . CESAREAN SECTION  08/10/2011   Procedure: CESAREAN SECTION;  Surgeon: Tereso Newcomer, MD;  Location: [facility];  Service: Gynecology;  Laterality: N/A;  . CYSTECTOMY    . DILATION AND CURETTAGE OF UTERUS  2007  . HERNIA REPAIR    . LAPAROSCOPIC GASTRIC BAND REMOVAL WITH LAPAROSCOPIC GASTRIC SLEEVE RESECTION    . TONSILLECTOMY    . TOOTH EXTRACTION    . uterine ateriography  12/21/2005   bilateral     OB History    Gravida  3   Para  2   Term  2   Preterm  0   AB  1   Living  2     SAB  0   TAB  1     Ectopic  0   Multiple  0   Live Births  2            Home Medications    Prior to Admission medications   Medication Sig Start Date End Date Taking? Authorizing Provider  acetaminophen (TYLENOL) 500 MG tablet Take 1,000 mg by mouth every 6 (six) hours as needed for moderate pain. Reported on 03/30/2016    [provider]  albuterol (PROVENTIL HFA;VENTOLIN HFA) 108 (90 Base) MCG/ACT inhaler Inhale 2 puffs into the lungs every 4 (four) hours as needed for wheezing or shortness of breath. 08/12/16   Elson Areas, PA-C  Calcium Carbonate-Vit D-Min (CALCIUM 1200 PO) Take by mouth.    [provider]  cyanocobalamin 500 MCG tablet Take 500 mcg by mouth daily.    [provider]  Multiple Vitamin (MULTIVITAMIN WITH MINERALS) TABS tablet Take 1 tablet by mouth daily.    [provider]  traMADol (ULTRAM) 50 MG tablet Take 1 tablet (50 mg total) by mouth every 6 (six) hours as needed. 06/23/17   Reva Bores, MD    Family History Family History  Problem Relation Age of Onset  . Diabetes Maternal Grandfather   . Hypertension Father   .  Cancer Maternal Grandmother     Social History Social History   Tobacco Use  . Smoking status: Never Smoker  . Smokeless tobacco: Never Used  Substance Use Topics  . Alcohol use: Yes    Comment: occasional  . Drug use: No    Types: Marijuana    Comment: History of marijuana use in past, none in 3 months     Allergies   Patient has no known allergies.   Review of Systems Review of Systems  Genitourinary: Positive for pelvic pain.  All other systems reviewed and are negative.    Physical Exam Updated Vital Signs BP 109/72 (BP Location: Left Arm)   Pulse 76   Temp 98.5 F (36.9 C) (Oral)   Resp 18   Ht 5' 7.5" (1.715 m)   Wt 134.7 kg (297 lb)   LMP 02/25/2018   SpO2 100%   BMI 45.83 kg/m   Physical Exam  Constitutional: She appears well-developed and well-nourished.  HENT:  Head:  Normocephalic and atraumatic.  Eyes: Pupils are equal, round, and reactive to light. Conjunctivae are normal.  Neck: Neck supple. No tracheal deviation present. No thyromegaly present.  Cardiovascular: Normal rate and regular rhythm.  No murmur heard. Pulmonary/Chest: Effort normal and breath sounds normal.  Abdominal: Soft. Bowel sounds are normal. There is tenderness. There is no guarding.  Morbidly obese, tender at bilateral lower quadrants  Genitourinary:  Genitourinary Comments: Pelvic examNo external lesion. Cervical os closed. Slight amount of whitish discharge. No cervical motion tenderness. Bilateral adnexal tenderness  Musculoskeletal: Normal range of motion. She exhibits no edema or tenderness.  Neurological: She is alert. Coordination normal.  Skin: Skin is warm and dry. No rash noted.  Psychiatric: She has a normal mood and affect.  Nursing note and vitals reviewed.    ED Treatments / Results  Labs (all labs ordered are listed, but only abnormal results are displayed) Labs Reviewed  WET PREP, GENITAL  URINALYSIS, ROUTINE W REFLEX MICROSCOPIC  PREGNANCY, URINE  RPR  HIV ANTIBODY (ROUTINE TESTING)  GC/CHLAMYDIA PROBE AMP ([place]) NOT AT [facility]    EKG None  Radiology No results found.  Procedures Procedures (including critical care time)  Medications Ordered in ED Medications - No data to display   Initial Impression / Assessment and Plan / ED Course  I have reviewed the triage vital signs and the nursing notes.  Pertinent labs & imaging results that were available during my care of the patient were reviewed by me and considered in my medical decision making (see chart for details).     12:05 PM she states pain is not improved Norco however she is requesting prescription. 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 [facility].  [facility] Controlled Substance  reporting System queried Results for orders placed or performed during the hospital encounter of 03/15/18  Wet prep, genital  Result Value Ref Range   Yeast Wet Prep HPF POC NONE SEEN NONE SEEN   Trich, Wet Prep NONE SEEN NONE SEEN   Clue Cells Wet Prep HPF POC NONE SEEN NONE SEEN   WBC, Wet Prep HPF POC FEW (A) NONE SEEN   Sperm NONE SEEN   Urinalysis, Routine w reflex microscopic  Result Value Ref Range   Color, Urine YELLOW YELLOW   APPearance CLEAR CLEAR   Specific Gravity, Urine 1.020 1.005 - 1.030   pH 7.5 5.0 - 8.0   Glucose,  UA NEGATIVE NEGATIVE mg/dL   Hgb urine dipstick NEGATIVE NEGATIVE   Bilirubin Urine NEGATIVE NEGATIVE   Ketones, ur 15 (A) NEGATIVE mg/dL   Protein, ur NEGATIVE NEGATIVE mg/dL   Nitrite NEGATIVE NEGATIVE   Leukocytes, UA NEGATIVE NEGATIVE  Pregnancy, urine  Result Value Ref Range   Preg Test, Ur NEGATIVE NEGATIVE  CBC with Differential/Platelet  Result Value Ref Range   WBC 4.8 4.0 - 10.5 K/uL   RBC 4.53 3.87 - 5.11 MIL/uL   Hemoglobin 12.1 12.0 - 15.0 g/dL   HCT 16.1 (L) 09.6 - 04.5 %   MCV 73.1 (L) 78.0 - 100.0 fL   MCH 26.7 26.0 - 34.0 pg   MCHC 36.6 (H) 30.0 - 36.0 g/dL   RDW 40.9 81.1 - 91.4 %   Platelets 281 150 - 400 K/uL   Neutrophils Relative % 32 %   Neutro Abs 1.5 (L) 1.7 - 7.7 K/uL   Lymphocytes Relative 55 %   Lymphs Abs 2.6 0.7 - 4.0 K/uL   Monocytes Relative 11 %   Monocytes Absolute 0.5 0.1 - 1.0 K/uL   Eosinophils Relative 2 %   Eosinophils Absolute 0.1 0.0 - 0.7 K/uL   Basophils Relative 0 %   Basophils Absolute 0.0 0.0 - 0.1 K/uL   US Pelvic Doppler (torsion R/o Or Mass Arterial Flow)  Result Date: 03/15/2018 CLINICAL DATA:  History of endometriosis.  Pelvic pain. EXAM: TRANSABDOMINAL AND TRANSVAGINAL ULTRASOUND OF PELVIS DOPPLER ULTRASOUND OF OVARIES TECHNIQUE: Both transabdominal and transvaginal ultrasound examinations of the pelvis were performed. Transabdominal technique was performed for global imaging of the  pelvis including uterus, ovaries, adnexal regions, and pelvic cul-de-sac. It was necessary to proceed with endovaginal exam following the transabdominal exam to visualize the endometrium and adnexa. Color and duplex Doppler ultrasound was utilized to evaluate blood flow to the ovaries. COMPARISON:  Pelvic ultrasound 05/24/2017 and 02/25/2016. FINDINGS: Uterus Measurements: 10.2 x 3.9 x 4.5 cm. No fibroids or other mass visualized. Endometrium Thickness: 0.8 cm.  No focal abnormality visualized. Right ovary Measurements: 3.8 x 2.2 x 3.4 cm. Normal appearance/no adnexal mass. Left ovary Measurements: 7.6 x 4.8 x 6.6 cm. Mixed echogenicity lesion measuring 7.2 x 4.4 x 6.4 cm is identified as seen on the prior examinations. Its appearance is not markedly changed. There is no flow within the lesion on Doppler imaging. Pulsed Doppler evaluation of both ovaries demonstrates normal low-resistance arterial and venous waveforms. Other findings No abnormal free fluid. IMPRESSION: No acute finding. No change in a 7.2 cm in diameter lesion in the left ovary likely representing an endometrioma. Electronically Signed   By: Drusilla Kanner M.D.   On: 03/15/2018 11:51   US Pelvic Complete With Transvaginal  Result Date: 03/15/2018 CLINICAL DATA:  History of endometriosis.  Pelvic pain. EXAM: TRANSABDOMINAL AND TRANSVAGINAL ULTRASOUND OF PELVIS DOPPLER ULTRASOUND OF OVARIES TECHNIQUE: Both transabdominal and transvaginal ultrasound examinations of the pelvis were performed. Transabdominal technique was performed for global imaging of the pelvis including uterus, ovaries, adnexal regions, and pelvic cul-de-sac. It was necessary to proceed with endovaginal exam following the transabdominal exam to visualize the endometrium and adnexa. Color and duplex Doppler ultrasound was utilized to evaluate blood flow to the ovaries. COMPARISON:  Pelvic ultrasound 05/24/2017 and 02/25/2016. FINDINGS: Uterus Measurements: 10.2 x 3.9 x 4.5 cm.  No fibroids or other mass visualized. Endometrium Thickness: 0.8 cm.  No focal abnormality visualized. Right ovary Measurements: 3.8 x 2.2 x 3.4 cm. Normal appearance/no adnexal mass. Left ovary Measurements: 7.6  x 4.8 x 6.6 cm. Mixed echogenicity lesion measuring 7.2 x 4.4 x 6.4 cm is identified as seen on the prior examinations. Its appearance is not markedly changed. There is no flow within the lesion on Doppler imaging. Pulsed Doppler evaluation of both ovaries demonstrates normal low-resistance arterial and venous waveforms. Other findings No abnormal free fluid. IMPRESSION: No acute finding. No change in a 7.2 cm in diameter lesion in the left ovary likely representing an endometrioma. Electronically Signed   By: Drusilla Kanner M.D.   On: 03/15/2018 11:51   Final Clinical Impressions(s) / ED Diagnoses  Diagnosis pelvic pain Final diagnoses:  None    ED Discharge Orders    None       Doug Sou, MD 03/15/18 1213

## 2018-03-15 NOTE — ED Triage Notes (Signed)
Low bilateral abd pain x 1 week. Hx of endometriosis. Denies vaginal bleeding or discharge, urinary symptoms, N/V

## 2018-03-16 LAB — GC/CHLAMYDIA PROBE AMP (~~LOC~~) NOT AT ARMC
CHLAMYDIA, DNA PROBE: NEGATIVE
Neisseria Gonorrhea: NEGATIVE

## 2018-03-16 LAB — RPR: RPR Ser Ql: NONREACTIVE

## 2018-03-16 LAB — HIV ANTIBODY (ROUTINE TESTING W REFLEX): HIV Screen 4th Generation wRfx: NONREACTIVE

## 2018-05-17 ENCOUNTER — Encounter: Payer: Self-pay | Admitting: Gynecologic Oncology

## 2018-05-17 ENCOUNTER — Inpatient Hospital Stay: Payer: Medicaid Other | Attending: Gynecologic Oncology | Admitting: Gynecologic Oncology

## 2018-05-17 VITALS — BP 122/73 | HR 89 | Temp 99.1°F | Resp 20 | Ht 67.5 in | Wt 302.1 lb

## 2018-05-17 DIAGNOSIS — N83202 Unspecified ovarian cyst, left side: Secondary | ICD-10-CM | POA: Diagnosis present

## 2018-05-17 DIAGNOSIS — R971 Elevated cancer antigen 125 [CA 125]: Secondary | ICD-10-CM | POA: Insufficient documentation

## 2018-05-17 DIAGNOSIS — N809 Endometriosis, unspecified: Secondary | ICD-10-CM

## 2018-05-17 NOTE — Patient Instructions (Signed)
Preparing for your Surgery  Plan for surgery on May 31, 2018 with Dr. Adolphus Birchwood at [facility].  You will be scheduled for a robotic assisted left salpingo-oophorectomy, lysis of adhesions.    Pre-operative Testing -You will receive a phone call from presurgical testing at [facility] to arrange for a pre-operative testing appointment before your surgery.  This appointment normally occurs one to two weeks before your scheduled surgery.   -Bring your insurance card, copy of an advanced directive if applicable, medication list  -At that visit, you will be asked to sign a consent for a possible blood transfusion in case a transfusion becomes necessary during surgery.  The need for a blood transfusion is rare but having consent is a necessary part of your care.     -You should not be taking blood thinners or aspirin at least ten days prior to surgery unless instructed by your surgeon.  Day Before Surgery at Home -You will be asked to take in a light diet the day before surgery.  Avoid carbonated beverages.  You will be advised to have nothing to eat or drink after midnight the evening before.    Eat a light diet the day before surgery.  Examples including soups, broths, toast, yogurt, mashed potatoes.  Things to avoid include carbonated beverages (fizzy beverages), raw fruits and raw vegetables, or beans.   If your bowels are filled with gas, your surgeon will have difficulty visualizing your pelvic organs which increases your surgical risks.  Your role in recovery Your role is to become active as soon as directed by your doctor, while still giving yourself time to heal.  Rest when you feel tired. You will be asked to do the following in order to speed your recovery:  - Cough and breathe deeply. This helps toclear and expand your lungs and can prevent pneumonia. You may be given a spirometer to practice deep breathing. A staff member will show you how to use the  spirometer. - Do mild physical activity. Walking or moving your legs help your circulation and body functions return to normal. A staff member will help you when you try to walk and will provide you with simple exercises. Do not try to get up or walk alone the first time. - Actively manage your pain. Managing your pain lets you move in comfort. We will ask you to rate your pain on a scale of zero to 10. It is your responsibility to tell your doctor or nurse where and how much you hurt so your pain can be treated.  Special Considerations -If you are diabetic, you may be placed on insulin after surgery to have closer control over your blood sugars to promote healing and recovery.  This does not mean that you will be discharged on insulin.  If applicable, your oral antidiabetics will be resumed when you are tolerating a solid diet.  -Your final pathology results from surgery should be available by the Friday after surgery and the results will be relayed to you when available.  -Dr. Antionette Char is the Surgeon that assists your GYN Oncologist with surgery.  The next day after your surgery you will either see your GYN Oncologist or Dr. Antionette Char.   Blood Transfusion Information WHAT IS A BLOOD TRANSFUSION? A transfusion is the replacement of blood or some of its parts. Blood is made up of multiple cells which provide different functions.  Red blood cells carry oxygen and are used for blood loss  replacement.  White blood cells fight against infection.  Platelets control bleeding.  Plasma helps clot blood.  Other blood products are available for specialized needs, such as hemophilia or other clotting disorders. BEFORE THE TRANSFUSION  Who gives blood for transfusions?   You may be able to donate blood to be used at a later date on yourself (autologous donation).  Relatives can be asked to donate blood. This is generally not any safer than if you have received blood from a  stranger. The same precautions are taken to ensure safety when a relative's blood is donated.  Healthy volunteers who are fully evaluated to make sure their blood is safe. This is blood bank blood. Transfusion therapy is the safest it has ever been in the practice of medicine. Before blood is taken from a donor, a complete history is taken to make sure that person has no history of diseases nor engages in risky social behavior (examples are intravenous drug use or sexual activity with multiple partners). The donor's travel history is screened to minimize risk of transmitting infections, such as malaria. The donated blood is tested for signs of infectious diseases, such as HIV and hepatitis. The blood is then tested to be sure it is compatible with you in order to minimize the chance of a transfusion reaction. If you or a relative donates blood, this is often done in anticipation of surgery and is not appropriate for emergency situations. It takes many days to process the donated blood. RISKS AND COMPLICATIONS Although transfusion therapy is very safe and saves many lives, the main dangers of transfusion include:   Getting an infectious disease.  Developing a transfusion reaction. This is an allergic reaction to something in the blood you were given. Every precaution is taken to prevent this. The decision to have a blood transfusion has been considered carefully by your caregiver before blood is given. Blood is not given unless the benefits outweigh the risks.

## 2018-05-17 NOTE — H&P (View-Only) (Signed)
Follow-up Note: Gyn-Onc  Consult was requested by Dr. Juliene Pina for the evaluation of Kathleen Watkins 32 y.o. female  CC:  Chief Complaint  Patient presents with  . Left ovarian cyst    Assessment/Plan:  Kathleen Watkins  is a 32 y.o.  year old with morbid obesity (BMI 59) and an 8cm left ovarian cyst. It is associated with an elevated CA 125 (109).  I have reviewed the images from her ultrasound from July, 2018 and May, 2019. It appears that the right ovarian cyst resolved, but the left endometrioma is persistent.  I feel that it is reasonable to proceed with surgery to remove it given that it is symptomatic. I explained that endometriosis surgery is associated with a high risk of complication particularly as this disease is usually adherent to surrounding structures.  Major risks include damage to surrounding visceral or GU structures.  There is also an increased risk for infection postoperatively from hematoma formation.  I explained that her obesity puts her at increased risk for perioperative complications.  I explained that in rare cases the ovaries densely adherent to the uterus and the only way to remove the adnexa is to perform hysterectomy the same time.  While the patient has completed childbearing and does not desire future fertility at this time, it would be a goal to preserve her fertility if at all possible she is a young woman.  However she is accepting of the possibility of hysterectomy and desires this if necessary to remove the left ovary.  If the right ovary is grossly normal we will keep this in situ.  Surgery has been scheduled for the first week of August 2019.  I explained to the patient that I will be out of town for part of August 2019 however I will have coverage for my patients from my partner should a complication arise postoperatively.  She feels comfortable with this.  I explained anticipated postoperative recovery and time off of work and restrictions.  HPI:  Kathleen Watkins is a 32 year old woman who is seen in consultation at the request of Dr Juliene Pina for a left ovarian complex mass and elevated CA 125.  The patient reports a history of LLQ pain. This was present in December 2016 which prompted a CT scan of the abdo/pelvis which demonstrated a 6.1cm ovarian cyst on the left.  She had continued pain after birth control insertion and underwent repeat US on 02/25/16 which revealed a Uterus measuring 8 x 3 x 4.8 cm with a 9 mm endometrial stripe. The right ovary measured 3.6 x 1.9 x 3.2 cm. The left ovary measured 8 x 5.7 x 8.4 cm and contained a complex cystic mass measuring 8 x 7.5 x 5.6 cm which was likely a hemorrhagic ovarian cyst but is nonspecific. There is no convincing internal blood flow within the cyst. The there is normal arteriovenous blood flow in the periphery. In follow-up to that ultrasound scan a CA-125 was drawn on 03/03/2016 and was elevated at 109.6.  The patient has extreme morbid obesity with a BMI 59 kg/m. She's been worked up at [facility] for a gastric sleeve procedure. She was scheduled to undergo this in June 2017 however when her ultrasound CA-125 result came back her bariatric surgeon, Dr. Lily Peer, decided it was best to cancel her bariatric surgery in favor of a GYN procedure.  The patient is otherwise fairly healthy despite her obesity. She's had 2 prior cesarean sections. She also has endometriosis which  is managed with a implantable progestin device.   Interval Hx:  She went on to receive a gastric sleeve procedure which was associated with an 80lb weight loss. She still desires an additional 50lb weight loss.   She continued to have persistent intermittent pains. These are predominantly associated with menses. She lost her insurance and therefore sought care in the ED.  In July, 2018 an ED US showed:  7.1 cm complex lesion in the left ovary, possibly reflecting an endometrioma or hemorrhagic cyst. Notably, a similar  lesion was present in 2017, which would favor the diagnosis of endometrioma.  6.1 cm complex cystic lesion in the right ovary, favoring a hemorrhagic cyst. However, given the lesion in the left ovary, an additional endometrioma is possible.  In May, 2019 an ED US showed:  Right ovary Measurements: 3.8 x 2.2 x 3.4 cm. Normal appearance/no adnexal mass. Left ovary Measurements: 7.6 x 4.8 x 6.6 cm. Mixed echogenicity lesion measuring 7.2 x 4.4 x 6.4 cm is identified as seen on the prior examinations. Its appearance is not markedly changed. There is no flow within the lesion on Doppler imaging. Pulsed Doppler evaluation of both ovaries demonstrates normal low-resistance arterial and venous waveforms.  Current Meds:  Outpatient Encounter Medications as of 05/17/2018  Medication Sig  . acetaminophen (TYLENOL) 500 MG tablet Take 1,000 mg by mouth every 6 (six) hours as needed for moderate pain. Reported on 03/30/2016  . Calcium Carbonate-Vit D-Min (CALCIUM 1200 PO) Take by mouth.  . cyanocobalamin 500 MCG tablet Take 500 mcg by mouth daily.  . Multiple Vitamin (MULTIVITAMIN WITH MINERALS) TABS tablet Take 1 tablet by mouth daily.  . [DISCONTINUED] albuterol (PROVENTIL HFA;VENTOLIN HFA) 108 (90 Base) MCG/ACT inhaler Inhale 2 puffs into the lungs every 4 (four) hours as needed for wheezing or shortness of breath. (Patient not taking: Reported on 05/17/2018)  . [DISCONTINUED] HYDROcodone-acetaminophen (NORCO) 5-325 MG tablet Take 1-2 tablets by mouth every 6 (six) hours as needed for severe pain. (Patient not taking: Reported on 05/17/2018)  . [DISCONTINUED] traMADol (ULTRAM) 50 MG tablet Take 1 tablet (50 mg total) by mouth every 6 (six) hours as needed. (Patient not taking: Reported on 05/17/2018)   No facility-administered encounter medications on file as of 05/17/2018.     Allergy: No Known Allergies  Social Hx:   Social History   Socioeconomic History  . Marital status: Single    Spouse  name: Not on file  . Number of children: Not on file  . Years of education: Not on file  . Highest education level: Not on file  Occupational History  . Not on file  Social Needs  . Financial resource strain: Not on file  . Food insecurity:    Worry: Not on file    Inability: Not on file  . Transportation needs:    Medical: Not on file    Non-medical: Not on file  Tobacco Use  . Smoking status: Never Smoker  . Smokeless tobacco: Never Used  Substance and Sexual Activity  . Alcohol use: Yes    Comment: occasional  . Drug use: No    Types: Marijuana    Comment: History of marijuana use in past, none in 3 months  . Sexual activity: Not Currently    Birth control/protection: None, Implant  Lifestyle  . Physical activity:    Days per week: Not on file    Minutes per session: Not on file  . Stress: Not on file  Relationships  . Social  connections:    Talks on phone: Not on file    Gets together: Not on file    Attends religious service: Not on file    Active member of club or organization: Not on file    Attends meetings of clubs or organizations: Not on file    Relationship status: Not on file  . Intimate partner violence:    Fear of current or ex partner: Not on file    Emotionally abused: Not on file    Physically abused: Not on file    Forced sexual activity: Not on file  Other Topics Concern  . Not on file  Social History Narrative  . Not on file    Past Surgical Hx:  Past Surgical History:  Procedure Laterality Date  . ADENOIDECTOMY    . CESAREAN SECTION    . CESAREAN SECTION  08/10/2011   Procedure: CESAREAN SECTION;  Surgeon: Tereso Newcomer, MD;  Location: [facility];  Service: Gynecology;  Laterality: N/A;  . CYSTECTOMY    . DILATION AND CURETTAGE OF UTERUS  2007  . HERNIA REPAIR    . LAPAROSCOPIC GASTRIC BAND REMOVAL WITH LAPAROSCOPIC GASTRIC SLEEVE RESECTION    . TONSILLECTOMY    . TOOTH EXTRACTION    . uterine ateriography  12/21/2005   bilateral     Past Medical Hx:  Past Medical History:  Diagnosis Date  . Anemia   . Bilateral ovarian cysts   . Endometriosis   . Obese   . STD (female)    Trich-tx'd in 11/2010,     Past Gynecological History:  Endometriosis documented by laparoscopy  No LMP recorded. (Menstrual status: Other).  Family Hx:  Family History  Problem Relation Age of Onset  . Diabetes Maternal Grandfather   . Hypertension Father   . Cancer Maternal Grandmother     Review of Systems:  Constitutional  Feels well,   ENT Normal appearing ears and nares bilaterally Skin/Breast  No rash, sores, jaundice, itching, dryness Cardiovascular  No chest pain, shortness of breath, or edema  Pulmonary  No cough or wheeze.  Gastro Intestinal  No nausea, vomitting, or diarrhoea. No bright red blood per rectum, + abdominal pain, no change in bowel movement, or constipation.  Genito Urinary  No frequency, urgency, dysuria,  Musculo Skeletal  No myalgia, arthralgia, joint swelling or pain  Neurologic  No weakness, numbness, change in gait,  Psychology  No depression, anxiety, insomnia.   Vitals:  Blood pressure 122/73, pulse 89, temperature 99.1 F (37.3 C), temperature source Oral, resp. rate 20, height 5' 7.5" (1.715 m), weight (!) 302 lb 1.6 oz (137 kg).  Physical Exam: WD in NAD Neck  Supple NROM, without any enlargements.  Lymph Node Survey No cervical supraclavicular or inguinal adenopathy Cardiovascular  Pulse normal rate, regularity and rhythm. S1 and S2 normal.  Lungs  Clear to auscultation bilateraly, without wheezes/crackles/rhonchi. Good air movement.  Skin  No rash/lesions/breakdown  Psychiatry  Alert and oriented to person, place, and time  Abdomen  Normoactive bowel sounds, abdomen soft, non-tender and obese without evidence of hernia.  Back No CVA tenderness Genito Urinary  Vulva/vagina: Normal external female genitalia.  No lesions. No discharge or bleeding.  Bladder/urethra:  No  lesions or masses, well supported bladder  Vagina: normal  Cervix: Normal appearing, no lesions.  Uterus: difficult to accurately discern due to body habitus.   Adnexa: no palpable masses. Rectal  Good tone, no masses no cul de sac nodularity.  Extremities  No bilateral cyanosis, clubbing or edema.   Luisa DagoEmma C Kimberlye Dilger, MD  05/17/2018, 4:39 PM

## 2018-05-17 NOTE — Progress Notes (Signed)
Follow-up Note: Gyn-Onc  Consult was requested by Dr. Juliene Pina for the evaluation of Kathleen Watkins 32 y.o. female  CC:  Chief Complaint  Patient presents with  . Left ovarian cyst    Assessment/Plan:  Kathleen Watkins  is a 31 y.o.  year old with morbid obesity (BMI 59) and an 8cm left ovarian cyst. It is associated with an elevated CA 125 (109).  I have reviewed the images from her ultrasound from July, 2018 and May, 2019. It appears that the right ovarian cyst resolved, but the left endometrioma is persistent.  I feel that it is reasonable to proceed with surgery to remove it given that it is symptomatic. I explained that endometriosis surgery is associated with a high risk of complication particularly as this disease is usually adherent to surrounding structures.  Major risks include damage to surrounding visceral or GU structures.  There is also an increased risk for infection postoperatively from hematoma formation.  I explained that her obesity puts her at increased risk for perioperative complications.  I explained that in rare cases the ovaries densely adherent to the uterus and the only way to remove the adnexa is to perform hysterectomy the same time.  While the patient has completed childbearing and does not desire future fertility at this time, it would be a goal to preserve her fertility if at all possible she is a young woman.  However she is accepting of the possibility of hysterectomy and desires this if necessary to remove the left ovary.  If the right ovary is grossly normal we will keep this in situ.  Surgery has been scheduled for the first week of August 2019.  I explained to the patient that I will be out of town for part of August 2019 however I will have coverage for my patients from my partner should a complication arise postoperatively.  She feels comfortable with this.  I explained anticipated postoperative recovery and time off of work and restrictions.  HPI:  Kathleen Watkins is a 32 year old woman who is seen in consultation at the request of Dr Juliene Pina for a left ovarian complex mass and elevated CA 125.  The patient reports a history of LLQ pain. This was present in December 2016 which prompted a CT scan of the abdo/pelvis which demonstrated a 6.1cm ovarian cyst on the left.  She had continued pain after birth control insertion and underwent repeat US on 02/25/16 which revealed a Uterus measuring 8 x 3 x 4.8 cm with a 9 mm endometrial stripe. The right ovary measured 3.6 x 1.9 x 3.2 cm. The left ovary measured 8 x 5.7 x 8.4 cm and contained a complex cystic mass measuring 8 x 7.5 x 5.6 cm which was likely a hemorrhagic ovarian cyst but is nonspecific. There is no convincing internal blood flow within the cyst. The there is normal arteriovenous blood flow in the periphery. In follow-up to that ultrasound scan a CA-125 was drawn on 03/03/2016 and was elevated at 109.6.  The patient has extreme morbid obesity with a BMI 59 kg/m. She's been worked up at [facility] for a gastric sleeve procedure. She was scheduled to undergo this in June 2017 however when her ultrasound CA-125 result came back her bariatric surgeon, Dr. Lily Peer, decided it was best to cancel her bariatric surgery in favor of a GYN procedure.  The patient is otherwise fairly healthy despite her obesity. She's had 2 prior cesarean sections. She also has endometriosis which  is managed with a implantable progestin device.   Interval Hx:  She went on to receive a gastric sleeve procedure which was associated with an 80lb weight loss. She still desires an additional 50lb weight loss.   She continued to have persistent intermittent pains. These are predominantly associated with menses. She lost her insurance and therefore sought care in the ED.  In July, 2018 an ED US showed:  7.1 cm complex lesion in the left ovary, possibly reflecting an endometrioma or hemorrhagic cyst. Notably, a similar  lesion was present in 2017, which would favor the diagnosis of endometrioma.  6.1 cm complex cystic lesion in the right ovary, favoring a hemorrhagic cyst. However, given the lesion in the left ovary, an additional endometrioma is possible.  In May, 2019 an ED US showed:  Right ovary Measurements: 3.8 x 2.2 x 3.4 cm. Normal appearance/no adnexal mass. Left ovary Measurements: 7.6 x 4.8 x 6.6 cm. Mixed echogenicity lesion measuring 7.2 x 4.4 x 6.4 cm is identified as seen on the prior examinations. Its appearance is not markedly changed. There is no flow within the lesion on Doppler imaging. Pulsed Doppler evaluation of both ovaries demonstrates normal low-resistance arterial and venous waveforms.  Current Meds:  Outpatient Encounter Medications as of 05/17/2018  Medication Sig  . acetaminophen (TYLENOL) 500 MG tablet Take 1,000 mg by mouth every 6 (six) hours as needed for moderate pain. Reported on 03/30/2016  . Calcium Carbonate-Vit D-Min (CALCIUM 1200 PO) Take by mouth.  . cyanocobalamin 500 MCG tablet Take 500 mcg by mouth daily.  . Multiple Vitamin (MULTIVITAMIN WITH MINERALS) TABS tablet Take 1 tablet by mouth daily.  . [DISCONTINUED] albuterol (PROVENTIL HFA;VENTOLIN HFA) 108 (90 Base) MCG/ACT inhaler Inhale 2 puffs into the lungs every 4 (four) hours as needed for wheezing or shortness of breath. (Patient not taking: Reported on 05/17/2018)  . [DISCONTINUED] HYDROcodone-acetaminophen (NORCO) 5-325 MG tablet Take 1-2 tablets by mouth every 6 (six) hours as needed for severe pain. (Patient not taking: Reported on 05/17/2018)  . [DISCONTINUED] traMADol (ULTRAM) 50 MG tablet Take 1 tablet (50 mg total) by mouth every 6 (six) hours as needed. (Patient not taking: Reported on 05/17/2018)   No facility-administered encounter medications on file as of 05/17/2018.     Allergy: No Known Allergies  Social Hx:   Social History   Socioeconomic History  . Marital status: Single    Spouse  name: Not on file  . Number of children: Not on file  . Years of education: Not on file  . Highest education level: Not on file  Occupational History  . Not on file  Social Needs  . Financial resource strain: Not on file  . Food insecurity:    Worry: Not on file    Inability: Not on file  . Transportation needs:    Medical: Not on file    Non-medical: Not on file  Tobacco Use  . Smoking status: Never Smoker  . Smokeless tobacco: Never Used  Substance and Sexual Activity  . Alcohol use: Yes    Comment: occasional  . Drug use: No    Types: Marijuana    Comment: History of marijuana use in past, none in 3 months  . Sexual activity: Not Currently    Birth control/protection: None, Implant  Lifestyle  . Physical activity:    Days per week: Not on file    Minutes per session: Not on file  . Stress: Not on file  Relationships  . Social  connections:    Talks on phone: Not on file    Gets together: Not on file    Attends religious service: Not on file    Active member of club or organization: Not on file    Attends meetings of clubs or organizations: Not on file    Relationship status: Not on file  . Intimate partner violence:    Fear of current or ex partner: Not on file    Emotionally abused: Not on file    Physically abused: Not on file    Forced sexual activity: Not on file  Other Topics Concern  . Not on file  Social History Narrative  . Not on file    Past Surgical Hx:  Past Surgical History:  Procedure Laterality Date  . ADENOIDECTOMY    . CESAREAN SECTION    . CESAREAN SECTION  08/10/2011   Procedure: CESAREAN SECTION;  Surgeon: Tereso Newcomer, MD;  Location: [facility];  Service: Gynecology;  Laterality: N/A;  . CYSTECTOMY    . DILATION AND CURETTAGE OF UTERUS  2007  . HERNIA REPAIR    . LAPAROSCOPIC GASTRIC BAND REMOVAL WITH LAPAROSCOPIC GASTRIC SLEEVE RESECTION    . TONSILLECTOMY    . TOOTH EXTRACTION    . uterine ateriography  12/21/2005   bilateral     Past Medical Hx:  Past Medical History:  Diagnosis Date  . Anemia   . Bilateral ovarian cysts   . Endometriosis   . Obese   . STD (female)    Trich-tx'd in 11/2010,     Past Gynecological History:  Endometriosis documented by laparoscopy  No LMP recorded. (Menstrual status: Other).  Family Hx:  Family History  Problem Relation Age of Onset  . Diabetes Maternal Grandfather   . Hypertension Father   . Cancer Maternal Grandmother     Review of Systems:  Constitutional  Feels well,   ENT Normal appearing ears and nares bilaterally Skin/Breast  No rash, sores, jaundice, itching, dryness Cardiovascular  No chest pain, shortness of breath, or edema  Pulmonary  No cough or wheeze.  Gastro Intestinal  No nausea, vomitting, or diarrhoea. No bright red blood per rectum, + abdominal pain, no change in bowel movement, or constipation.  Genito Urinary  No frequency, urgency, dysuria,  Musculo Skeletal  No myalgia, arthralgia, joint swelling or pain  Neurologic  No weakness, numbness, change in gait,  Psychology  No depression, anxiety, insomnia.   Vitals:  Blood pressure 122/73, pulse 89, temperature 99.1 F (37.3 C), temperature source Oral, resp. rate 20, height 5' 7.5" (1.715 m), weight (!) 302 lb 1.6 oz (137 kg).  Physical Exam: WD in NAD Neck  Supple NROM, without any enlargements.  Lymph Node Survey No cervical supraclavicular or inguinal adenopathy Cardiovascular  Pulse normal rate, regularity and rhythm. S1 and S2 normal.  Lungs  Clear to auscultation bilateraly, without wheezes/crackles/rhonchi. Good air movement.  Skin  No rash/lesions/breakdown  Psychiatry  Alert and oriented to person, place, and time  Abdomen  Normoactive bowel sounds, abdomen soft, non-tender and obese without evidence of hernia.  Back No CVA tenderness Genito Urinary  Vulva/vagina: Normal external female genitalia.  No lesions. No discharge or bleeding.  Bladder/urethra:  No  lesions or masses, well supported bladder  Vagina: normal  Cervix: Normal appearing, no lesions.  Uterus: difficult to accurately discern due to body habitus.   Adnexa: no palpable masses. Rectal  Good tone, no masses no cul de sac nodularity.  Extremities  No bilateral cyanosis, clubbing or edema.   Luisa DagoEmma C Adien Kimmel, MD  05/17/2018, 4:39 PM

## 2018-05-24 NOTE — Patient Instructions (Addendum)
Kathleen Watkins  05/24/2018   Your procedure is scheduled on: 05-31-18  Report to [facility] Main  Entrance   Report to admitting at 0730 AM    Call this number if you have problems the morning of surgery [phone]    Remember: Eat a light diet the day before surgery.  Examples including soups, broths, toast, yogurt, mashed potatoes.  Things to avoid include carbonated beverages (fizzy beverages), raw fruits and raw vegetables, or beans.   If your bowels are filled with gas, your surgeon will have difficulty visualizing your pelvic organs which increases your surgical risks :  NO SOLID FOOD AFTER MIDNIGHT THE NIGHT PRIOR TO SURGERY. NOTHING BY MOUTH EXCEPT CLEAR LIQUIDS UNTIL 3 HOURS PRIOR TO SCHEULED SURGERY. PLEASE FINISH ENSURE DRINK PER SURGEON ORDER 3 HOURS PRIOR TO SCHEDULED SURGERY TIME WHICH NEEDS TO BE COMPLETED AT   0700 am.    CLEAR LIQUID DIET   Foods Allowed                                                                     Foods Excluded  Coffee and tea, regular and decaf                             liquids that you cannot  Plain Jell-O in any flavor                                             see through such as: Fruit ices (not with fruit pulp)                                     milk, soups, orange juice  Iced Popsicles                                    All solid food                                     Cranberry, grape and apple juices Sports drinks like Gatorade Lightly seasoned clear broth or consume(fat free) Sugar, honey syrup  Sample Menu Breakfast                                Lunch                                     Supper Cranberry juice                    Beef broth  Chicken broth Jell-O                                     Grape juice                           Apple juice Coffee or tea                        Jell-O                                      Popsicle              Coffee or tea                        Coffee or tea  _____________________________________________________________________    Take these medicines the morning of surgery with A SIP OF WATER: NONE              You may not have any metal on your body including hair pins and              piercings  Do not wear jewelry, make-up, lotions, powders or perfumes, deodorant             Do not wear nail polish.  Do not shave  48 hours prior to surgery.     Do not bring valuables to the hospital. [facility] IS NOT             RESPONSIBLE   FOR VALUABLES.  Contacts, dentures or bridgework may not be worn into surgery.      Patients discharged the day of surgery will not be allowed to drive home.  Name and phone number of your driver:  Special Instructions: N/A              Please read over the following fact sheets you were given: _____________________________________________________________________  [facility] - Preparing for Surgery Before surgery, you can play an important role.  Because skin is not sterile, your skin needs to be as free of germs as possible.  You can reduce the number of germs on your skin by washing with CHG (chlorahexidine gluconate) soap before surgery.  CHG is an antiseptic cleaner which kills germs and bonds with the skin to continue killing germs even after washing. Please DO NOT use if you have an allergy to CHG or antibacterial soaps.  If your skin becomes reddened/irritated stop using the CHG and inform your nurse when you arrive at Short Stay. Do not shave (including legs and underarms) for at least 48 hours prior to the first CHG shower.  You may shave your face/neck. Please follow these instructions carefully:  1.  Shower with CHG Soap the night before surgery and the  morning of Surgery.  2.  If you choose to wash your hair, wash your hair first as usual with your  normal  shampoo.  3.  After you shampoo, rinse your hair and body thoroughly to remove  the  shampoo.                           4.  Use CHG as you would any other liquid soap.  You can apply chg directly  to the skin and wash                       Gently with a scrungie or clean washcloth.  5.  Apply the CHG Soap to your body ONLY FROM THE NECK DOWN.   Do not use on face/ open                           Wound or open sores. Avoid contact with eyes, ears mouth and genitals (private parts).                       Wash face,  Genitals (private parts) with your normal soap.             6.  Wash thoroughly, paying special attention to the area where your surgery  will be performed.  7.  Thoroughly rinse your body with warm water from the neck down.  8.  DO NOT shower/wash with your normal soap after using and rinsing off  the CHG Soap.                9.  Pat yourself dry with a clean towel.            10.  Wear clean pajamas.            11.  Place clean sheets on your bed the night of your first shower and do not  sleep with pets. Day of Surgery : Do not apply any lotions/deodorants the morning of surgery.  Please wear clean clothes to the hospital/surgery center.  FAILURE TO FOLLOW THESE INSTRUCTIONS MAY RESULT IN THE CANCELLATION OF YOUR SURGERY PATIENT SIGNATURE_________________________________  NURSE SIGNATURE__________________________________  ________________________________________________________________________   Kathleen MireIncentive Spirometer  An incentive spirometer is a tool that can help keep your lungs clear and active. This tool measures how well you are filling your lungs with each breath. Taking long deep breaths may help reverse or decrease the chance of developing breathing (pulmonary) problems (especially infection) following:  A long period of time when you are unable to move or be active. BEFORE THE PROCEDURE   If the spirometer includes an indicator to show your best effort, your nurse or respiratory therapist will set it to a desired goal.  If possible, sit up  straight or lean slightly forward. Try not to slouch.  Hold the incentive spirometer in an upright position. INSTRUCTIONS FOR USE  1. Sit on the edge of your bed if possible, or sit up as far as you can in bed or on a chair. 2. Hold the incentive spirometer in an upright position. 3. Breathe out normally. 4. Place the mouthpiece in your mouth and seal your lips tightly around it. 5. Breathe in slowly and as deeply as possible, raising the piston or the ball toward the top of the column. 6. Hold your breath for 3-5 seconds or for as long as possible. Allow the piston or ball to fall to the bottom of the column. 7. Remove the mouthpiece from your mouth and breathe out normally. 8. Rest for a few seconds and repeat Steps 1 through 7 at least 10 times every 1-2 hours when you are awake. Take your time and take a few normal breaths between deep breaths. 9. The spirometer may include an indicator to show your best effort. Use the indicator as a goal to work toward  during each repetition. 10. After each set of 10 deep breaths, practice coughing to be sure your lungs are clear. If you have an incision (the cut made at the time of surgery), support your incision when coughing by placing a pillow or rolled up towels firmly against it. Once you are able to get out of bed, walk around indoors and cough well. You may stop using the incentive spirometer when instructed by your caregiver.  RISKS AND COMPLICATIONS  Take your time so you do not get dizzy or light-headed.  If you are in pain, you may need to take or ask for pain medication before doing incentive spirometry. It is harder to take a deep breath if you are having pain. AFTER USE  Rest and breathe slowly and easily.  It can be helpful to keep track of a log of your progress. Your caregiver can provide you with a simple table to help with this. If you are using the spirometer at home, follow these instructions: SEEK MEDICAL CARE IF:   You are  having difficultly using the spirometer.  You have trouble using the spirometer as often as instructed.  Your pain medication is not giving enough relief while using the spirometer.  You develop fever of 100.5 F (38.1 C) or higher. SEEK IMMEDIATE MEDICAL CARE IF:   You cough up bloody sputum that had not been present before.  You develop fever of 102 F (38.9 C) or greater.  You develop worsening pain at or near the incision site. MAKE SURE YOU:   Understand these instructions.  Will watch your condition.  Will get help right away if you are not doing well or get worse. Document Released: 02/22/2007 Document Revised: 01/04/2012 Document Reviewed: 04/25/2007 Exit[employer] Patient Information 2014 Exit[employer], [place].   ________________________________________________________________________   WHAT IS A BLOOD TRANSFUSION? Blood Transfusion Information  A transfusion is the replacement of blood or some of its parts. Blood is made up of multiple cells which provide different functions.  Red blood cells carry oxygen and are used for blood loss replacement.  White blood cells fight against infection.  Platelets control bleeding.  Plasma helps clot blood.  Other blood products are available for specialized needs, such as hemophilia or other clotting disorders. BEFORE THE TRANSFUSION  Who gives blood for transfusions?   Healthy volunteers who are fully evaluated to make sure their blood is safe. This is blood bank blood. Transfusion therapy is the safest it has ever been in the practice of medicine. Before blood is taken from a donor, a complete history is taken to make sure that person has no history of diseases nor engages in risky social behavior (examples are intravenous drug use or sexual activity with multiple partners). The donor's travel history is screened to minimize risk of transmitting infections, such as malaria. The donated blood is tested for signs of infectious  diseases, such as HIV and hepatitis. The blood is then tested to be sure it is compatible with you in order to minimize the chance of a transfusion reaction. If you or a relative donates blood, this is often done in anticipation of surgery and is not appropriate for emergency situations. It takes many days to process the donated blood. RISKS AND COMPLICATIONS Although transfusion therapy is very safe and saves many lives, the main dangers of transfusion include:   Getting an infectious disease.  Developing a transfusion reaction. This is an allergic reaction to something in the blood you were given. Every precaution is taken  to prevent this. The decision to have a blood transfusion has been considered carefully by your caregiver before blood is given. Blood is not given unless the benefits outweigh the risks. AFTER THE TRANSFUSION  Right after receiving a blood transfusion, you will usually feel much better and more energetic. This is especially true if your red blood cells have gotten low (anemic). The transfusion raises the level of the red blood cells which carry oxygen, and this usually causes an energy increase.  The nurse administering the transfusion will monitor you carefully for complications. HOME CARE INSTRUCTIONS  No special instructions are needed after a transfusion. You may find your energy is better. Speak with your caregiver about any limitations on activity for underlying diseases you may have. SEEK MEDICAL CARE IF:   Your condition is not improving after your transfusion.  You develop redness or irritation at the intravenous (IV) site. SEEK IMMEDIATE MEDICAL CARE IF:  Any of the following symptoms occur over the next 12 hours:  Shaking chills.  You have a temperature by mouth above 102 F (38.9 C), not controlled by medicine.  Chest, back, or muscle pain.  People around you feel you are not acting correctly or are confused.  Shortness of breath or difficulty  breathing.  Dizziness and fainting.  You get a rash or develop hives.  You have a decrease in urine output.  Your urine turns a dark color or changes to pink, red, or brown. Any of the following symptoms occur over the next 10 days:  You have a temperature by mouth above 102 F (38.9 C), not controlled by medicine.  Shortness of breath.  Weakness after normal activity.  The white part of the eye turns yellow (jaundice).  You have a decrease in the amount of urine or are urinating less often.  Your urine turns a dark color or changes to pink, red, or brown. Document Released: 10/09/2000 Document Revised: 01/04/2012 Document Reviewed: 05/28/2008 [facility] Patient Information 2014 [place], [place].  _______________________________________________________________________

## 2018-05-25 ENCOUNTER — Encounter (HOSPITAL_COMMUNITY)
Admission: RE | Admit: 2018-05-25 | Discharge: 2018-05-25 | Disposition: A | Discharge status: Home / Self Care | Payer: Medicaid Other | Source: Ambulatory Visit | Attending: Gynecologic Oncology | Admitting: Gynecologic Oncology

## 2018-05-25 ENCOUNTER — Encounter (HOSPITAL_COMMUNITY): Payer: Self-pay

## 2018-05-25 ENCOUNTER — Other Ambulatory Visit: Payer: Self-pay

## 2018-05-25 DIAGNOSIS — Z01812 Encounter for preprocedural laboratory examination: Secondary | ICD-10-CM | POA: Diagnosis present

## 2018-05-25 HISTORY — DX: Sleep apnea, unspecified: G47.30

## 2018-05-25 HISTORY — DX: Umbilical hernia without obstruction or gangrene: K42.9

## 2018-05-25 LAB — COMPREHENSIVE METABOLIC PANEL
ALT: 12 U/L (ref 0–44)
AST: 15 U/L (ref 15–41)
Albumin: 4 g/dL (ref 3.5–5.0)
Alkaline Phosphatase: 75 U/L (ref 38–126)
Anion gap: 5 (ref 5–15)
BUN: 13 mg/dL (ref 6–20)
CALCIUM: 9 mg/dL (ref 8.9–10.3)
CHLORIDE: 106 mmol/L (ref 98–111)
CO2: 30 mmol/L (ref 22–32)
CREATININE: 0.73 mg/dL (ref 0.44–1.00)
GFR calc Af Amer: >60 mL/min (ref >60)
Glucose, Bld: 104 mg/dL — ABNORMAL HIGH (ref 70–99)
Potassium: 4.3 mmol/L (ref 3.5–5.1)
SODIUM: 141 mmol/L (ref 135–145)
Total Bilirubin: 0.8 mg/dL (ref 0.3–1.2)
Total Protein: 8 g/dL (ref 6.5–8.1)

## 2018-05-25 LAB — CBC
HCT: 34.6 % — ABNORMAL LOW (ref 36.0–46.0)
HEMOGLOBIN: 12.3 g/dL (ref 12.0–15.0)
MCH: 26.9 pg (ref 26.0–34.0)
MCHC: 35.5 g/dL (ref 30.0–36.0)
MCV: 75.5 fL — AB (ref 78.0–100.0)
PLATELETS: 288 10*3/uL (ref 150–400)
RBC: 4.58 MIL/uL (ref 3.87–5.11)
RDW: 15.9 % — ABNORMAL HIGH (ref 11.5–15.5)
WBC: 4.2 10*3/uL (ref 4.0–10.5)

## 2018-05-25 LAB — URINALYSIS, ROUTINE W REFLEX MICROSCOPIC
BILIRUBIN URINE: NEGATIVE
Glucose, UA: NEGATIVE mg/dL
HGB URINE DIPSTICK: NEGATIVE
KETONES UR: NEGATIVE mg/dL
Leukocytes, UA: NEGATIVE
Nitrite: NEGATIVE
PROTEIN: NEGATIVE mg/dL
Specific Gravity, Urine: 1.024 (ref 1.005–1.030)
pH: 6 (ref 5.0–8.0)

## 2018-05-25 LAB — PREGNANCY, URINE: Preg Test, Ur: NEGATIVE

## 2018-05-26 LAB — TYPE AND SCREEN
ABO/RH(D): O POS
ANTIBODY SCREEN: POSITIVE

## 2018-05-26 LAB — CA 125: Cancer Antigen (CA) 125: 80.5 U/mL — ABNORMAL HIGH (ref 0.0–38.1)

## 2018-05-30 MED ORDER — DEXTROSE 5 % IV SOLN
3.0000 g | INTRAVENOUS | Status: AC
  Administered 2018-05-31: 3 g via INTRAVENOUS
  Filled 2018-05-30: qty 3

## 2018-05-31 ENCOUNTER — Ambulatory Visit (HOSPITAL_COMMUNITY): Payer: Medicaid Other | Admitting: Certified Registered Nurse Anesthetist

## 2018-05-31 ENCOUNTER — Other Ambulatory Visit: Payer: Self-pay

## 2018-05-31 ENCOUNTER — Ambulatory Visit (HOSPITAL_COMMUNITY)
Admission: RE | Admit: 2018-05-31 | Discharge: 2018-05-31 | Disposition: A | Discharge status: Home / Self Care | Payer: Medicaid Other | Source: Ambulatory Visit | Attending: Gynecologic Oncology | Admitting: Gynecologic Oncology

## 2018-05-31 ENCOUNTER — Encounter (HOSPITAL_COMMUNITY): Payer: Self-pay | Admitting: *Deleted

## 2018-05-31 ENCOUNTER — Encounter (HOSPITAL_COMMUNITY)
Admission: RE | Disposition: A | Discharge status: Home / Self Care | Payer: Self-pay | Source: Ambulatory Visit | Attending: Gynecologic Oncology

## 2018-05-31 DIAGNOSIS — N736 Female pelvic peritoneal adhesions (postinfective): Secondary | ICD-10-CM

## 2018-05-31 DIAGNOSIS — N83202 Unspecified ovarian cyst, left side: Secondary | ICD-10-CM

## 2018-05-31 DIAGNOSIS — Z79899 Other long term (current) drug therapy: Secondary | ICD-10-CM | POA: Insufficient documentation

## 2018-05-31 DIAGNOSIS — N809 Endometriosis, unspecified: Secondary | ICD-10-CM | POA: Diagnosis not present

## 2018-05-31 DIAGNOSIS — N135 Crossing vessel and stricture of ureter without hydronephrosis: Secondary | ICD-10-CM

## 2018-05-31 DIAGNOSIS — Z6841 Body Mass Index (BMI) 40.0 and over, adult: Secondary | ICD-10-CM | POA: Diagnosis not present

## 2018-05-31 DIAGNOSIS — N83201 Unspecified ovarian cyst, right side: Secondary | ICD-10-CM

## 2018-05-31 HISTORY — PX: ROBOTIC ASSISTED TOTAL HYSTERECTOMY WITH BILATERAL SALPINGO OOPHERECTOMY: SHX6086

## 2018-05-31 HISTORY — PX: LYSIS OF ADHESION: SHX5961

## 2018-05-31 LAB — TYPE AND SCREEN
ABO/RH(D): O POS
Antibody Screen: POSITIVE

## 2018-05-31 SURGERY — HYSTERECTOMY, TOTAL, ROBOT-ASSISTED, LAPAROSCOPIC, WITH BILATERAL SALPINGO-OOPHORECTOMY
Anesthesia: General

## 2018-05-31 MED ORDER — MIDAZOLAM HCL 2 MG/2ML IJ SOLN
INTRAMUSCULAR | Status: AC
  Filled 2018-05-31: qty 2

## 2018-05-31 MED ORDER — SCOPOLAMINE 1 MG/3DAYS TD PT72
1.0000 | MEDICATED_PATCH | TRANSDERMAL | Status: DC
  Administered 2018-05-31: 1.5 mg via TRANSDERMAL
  Filled 2018-05-31: qty 1

## 2018-05-31 MED ORDER — SODIUM CHLORIDE 0.9% FLUSH: 3.0000 mL | INTRAVENOUS | Status: DC | PRN

## 2018-05-31 MED ORDER — KETOROLAC TROMETHAMINE 30 MG/ML IJ SOLN
INTRAMUSCULAR | Status: AC
  Filled 2018-05-31: qty 1

## 2018-05-31 MED ORDER — DEXAMETHASONE SODIUM PHOSPHATE 10 MG/ML IJ SOLN
INTRAMUSCULAR | Status: DC | PRN
  Administered 2018-05-31: 10 mg via INTRAVENOUS

## 2018-05-31 MED ORDER — LIDOCAINE 2% (20 MG/ML) 5 ML SYRINGE
INTRAMUSCULAR | Status: AC
  Filled 2018-05-31: qty 10

## 2018-05-31 MED ORDER — GABAPENTIN 300 MG PO CAPS
300.0000 mg | ORAL_CAPSULE | ORAL | Status: AC
  Administered 2018-05-31: 300 mg via ORAL
  Filled 2018-05-31: qty 1

## 2018-05-31 MED ORDER — PHENYLEPHRINE 40 MCG/ML (10ML) SYRINGE FOR IV PUSH (FOR BLOOD PRESSURE SUPPORT)
PREFILLED_SYRINGE | INTRAVENOUS | Status: DC | PRN
  Administered 2018-05-31 (×6): 80 ug via INTRAVENOUS

## 2018-05-31 MED ORDER — KETOROLAC TROMETHAMINE 15 MG/ML IJ SOLN: 15.0000 mg | Freq: Once | INTRAMUSCULAR | Status: DC | PRN

## 2018-05-31 MED ORDER — PROMETHAZINE HCL 25 MG/ML IJ SOLN
INTRAMUSCULAR | Status: AC
  Filled 2018-05-31: qty 1

## 2018-05-31 MED ORDER — FENTANYL CITRATE (PF) 100 MCG/2ML IJ SOLN
25.0000 ug | INTRAMUSCULAR | Status: DC | PRN
  Administered 2018-05-31 (×2): 50 ug via INTRAVENOUS

## 2018-05-31 MED ORDER — ROCURONIUM BROMIDE 10 MG/ML (PF) SYRINGE
PREFILLED_SYRINGE | INTRAVENOUS | Status: DC | PRN
  Administered 2018-05-31: 50 mg via INTRAVENOUS
  Administered 2018-05-31: 30 mg via INTRAVENOUS

## 2018-05-31 MED ORDER — ACETAMINOPHEN 325 MG PO TABS: 650.0000 mg | ORAL_TABLET | ORAL | Status: DC | PRN

## 2018-05-31 MED ORDER — OXYCODONE-ACETAMINOPHEN 5-325 MG PO TABS: 1.0000 | ORAL_TABLET | Freq: Four times a day (QID) | ORAL | 0 refills | Status: DC | PRN

## 2018-05-31 MED ORDER — FENTANYL CITRATE (PF) 250 MCG/5ML IJ SOLN
INTRAMUSCULAR | Status: DC | PRN
  Administered 2018-05-31: 100 ug via INTRAVENOUS
  Administered 2018-05-31: 50 ug via INTRAVENOUS
  Administered 2018-05-31: 100 ug via INTRAVENOUS

## 2018-05-31 MED ORDER — SODIUM CHLORIDE 0.9% FLUSH: 3.0000 mL | Freq: Two times a day (BID) | INTRAVENOUS | Status: DC

## 2018-05-31 MED ORDER — KETAMINE HCL 10 MG/ML IJ SOLN
INTRAMUSCULAR | Status: DC | PRN
  Administered 2018-05-31: 50 mg via INTRAVENOUS

## 2018-05-31 MED ORDER — LIDOCAINE 2% (20 MG/ML) 5 ML SYRINGE
INTRAMUSCULAR | Status: DC | PRN
Start: 2018-05-31 — End: 2018-05-31
  Administered 2018-05-31: 1.5 mg/kg/h via INTRAVENOUS

## 2018-05-31 MED ORDER — SUGAMMADEX SODIUM 200 MG/2ML IV SOLN
INTRAVENOUS | Status: DC | PRN
  Administered 2018-05-31: 200 mg via INTRAVENOUS

## 2018-05-31 MED ORDER — KETOROLAC TROMETHAMINE 30 MG/ML IJ SOLN
30.0000 mg | Freq: Once | INTRAMUSCULAR | Status: AC | PRN
  Administered 2018-05-31: 30 mg via INTRAVENOUS

## 2018-05-31 MED ORDER — HYDROMORPHONE HCL 1 MG/ML IJ SOLN
0.2500 mg | INTRAMUSCULAR | Status: DC | PRN
  Administered 2018-05-31 (×4): 0.25 mg via INTRAVENOUS

## 2018-05-31 MED ORDER — DEXAMETHASONE SODIUM PHOSPHATE 4 MG/ML IJ SOLN: 4.0000 mg | INTRAMUSCULAR | Status: DC

## 2018-05-31 MED ORDER — ONDANSETRON HCL 4 MG/2ML IJ SOLN
INTRAMUSCULAR | Status: DC | PRN
  Administered 2018-05-31: 4 mg via INTRAVENOUS

## 2018-05-31 MED ORDER — LIDOCAINE 2% (20 MG/ML) 5 ML SYRINGE
INTRAMUSCULAR | Status: DC | PRN
  Administered 2018-05-31: 80 mg via INTRAVENOUS

## 2018-05-31 MED ORDER — LACTATED RINGERS IR SOLN
Status: DC | PRN
  Administered 2018-05-31: 1000 mL

## 2018-05-31 MED ORDER — PHENYLEPHRINE 40 MCG/ML (10ML) SYRINGE FOR IV PUSH (FOR BLOOD PRESSURE SUPPORT)
PREFILLED_SYRINGE | INTRAVENOUS | Status: AC
  Filled 2018-05-31: qty 10

## 2018-05-31 MED ORDER — FENTANYL CITRATE (PF) 100 MCG/2ML IJ SOLN
INTRAMUSCULAR | Status: AC
  Filled 2018-05-31: qty 2

## 2018-05-31 MED ORDER — PROMETHAZINE HCL 25 MG/ML IJ SOLN
6.2500 mg | INTRAMUSCULAR | Status: DC | PRN
  Administered 2018-05-31: 6.25 mg via INTRAVENOUS

## 2018-05-31 MED ORDER — LACTATED RINGERS IV SOLN
INTRAVENOUS | Status: DC
  Administered 2018-05-31 (×2): via INTRAVENOUS

## 2018-05-31 MED ORDER — HYDROMORPHONE HCL 1 MG/ML IJ SOLN
INTRAMUSCULAR | Status: AC
  Filled 2018-05-31: qty 1

## 2018-05-31 MED ORDER — MORPHINE SULFATE (PF) 4 MG/ML IV SOLN: 2.0000 mg | INTRAVENOUS | Status: DC | PRN

## 2018-05-31 MED ORDER — SODIUM CHLORIDE 0.9 % IV SOLN: 250.0000 mL | INTRAVENOUS | Status: DC | PRN

## 2018-05-31 MED ORDER — ACETAMINOPHEN 500 MG PO TABS
1000.0000 mg | ORAL_TABLET | ORAL | Status: AC
  Administered 2018-05-31: 1000 mg via ORAL
  Filled 2018-05-31: qty 2

## 2018-05-31 MED ORDER — PROPOFOL 10 MG/ML IV BOLUS
INTRAVENOUS | Status: DC | PRN
  Administered 2018-05-31: 200 mg via INTRAVENOUS

## 2018-05-31 MED ORDER — ACETAMINOPHEN 650 MG RE SUPP
650.0000 mg | RECTAL | Status: DC | PRN
  Filled 2018-05-31: qty 1

## 2018-05-31 MED ORDER — OXYCODONE HCL 5 MG PO TABS: 5.0000 mg | ORAL_TABLET | ORAL | Status: DC | PRN

## 2018-05-31 MED ORDER — MIDAZOLAM HCL 5 MG/5ML IJ SOLN
INTRAMUSCULAR | Status: DC | PRN
  Administered 2018-05-31: 2 mg via INTRAVENOUS

## 2018-05-31 MED ORDER — CELECOXIB 200 MG PO CAPS
400.0000 mg | ORAL_CAPSULE | ORAL | Status: AC
  Administered 2018-05-31: 400 mg via ORAL
  Filled 2018-05-31: qty 2

## 2018-05-31 MED ORDER — FENTANYL CITRATE (PF) 250 MCG/5ML IJ SOLN
INTRAMUSCULAR | Status: AC
  Filled 2018-05-31: qty 5

## 2018-05-31 SURGICAL SUPPLY — 59 items
ADH SKN CLS APL DERMABOND .7 (GAUZE/BANDAGES/DRESSINGS) ×2
AGENT HMST KT MTR STRL THRMB (HEMOSTASIS)
APL ESCP 34 STRL LF DISP (HEMOSTASIS)
APPLICATOR SURGIFLO ENDO (HEMOSTASIS) IMPLANT
BAG LAPAROSCOPIC 12 15 PORT 16 (BASKET) IMPLANT
BAG RETRIEVAL 12/15 (BASKET)
BAG SPEC RTRVL LRG 6X4 10 (ENDOMECHANICALS)
BLADE EXTENDED COATED 6.5IN (ELECTRODE) ×2 IMPLANT
BRIEF STRETCH FOR OB PAD LRG (UNDERPADS AND DIAPERS) ×1 IMPLANT
COVER BACK TABLE 60X90IN (DRAPES) ×3 IMPLANT
COVER TIP SHEARS 8 DVNC (MISCELLANEOUS) ×2 IMPLANT
COVER TIP SHEARS 8MM DA VINCI (MISCELLANEOUS) ×1
DERMABOND ADVANCED (GAUZE/BANDAGES/DRESSINGS) ×1
DERMABOND ADVANCED .7 DNX12 (GAUZE/BANDAGES/DRESSINGS) ×2 IMPLANT
DRAPE ARM DVNC X/XI (DISPOSABLE) ×8 IMPLANT
DRAPE COLUMN DVNC XI (DISPOSABLE) ×2 IMPLANT
DRAPE DA VINCI XI ARM (DISPOSABLE) ×4
DRAPE DA VINCI XI COLUMN (DISPOSABLE) ×1
DRAPE SHEET LG 3/4 BI-LAMINATE (DRAPES) ×3 IMPLANT
DRAPE SURG IRRIG POUCH 19X23 (DRAPES) ×3 IMPLANT
ELECT BLADE TIP CTD 4 INCH (ELECTRODE) ×1 IMPLANT
ELECT PENCIL ROCKER SW 15FT (MISCELLANEOUS) ×1 IMPLANT
ELECT REM PT RETURN 15FT ADLT (MISCELLANEOUS) ×3 IMPLANT
GAUZE 4X4 16PLY RFD (DISPOSABLE) ×2 IMPLANT
GLOVE BIO SURGEON STRL SZ 6 (GLOVE) ×12 IMPLANT
GLOVE BIO SURGEON STRL SZ 6.5 (GLOVE) ×8 IMPLANT
GOWN STRL REUS W/ TWL LRG LVL3 (GOWN DISPOSABLE) ×4 IMPLANT
GOWN STRL REUS W/TWL LRG LVL3 (GOWN DISPOSABLE) ×9
HOLDER FOLEY CATH W/STRAP (MISCELLANEOUS) IMPLANT
IRRIG SUCT STRYKERFLOW 2 WTIP (MISCELLANEOUS) ×3
IRRIGATION SUCT STRKRFLW 2 WTP (MISCELLANEOUS) ×2 IMPLANT
KIT PROCEDURE DA VINCI SI (MISCELLANEOUS)
KIT PROCEDURE DVNC SI (MISCELLANEOUS) IMPLANT
MANIPULATOR UTERINE 4.5 ZUMI (MISCELLANEOUS) ×3 IMPLANT
NDL SPNL 18GX3.5 QUINCKE PK (NEEDLE) IMPLANT
NEEDLE SPNL 18GX3.5 QUINCKE PK (NEEDLE) IMPLANT
OBTURATOR OPTICAL STANDARD 8MM (TROCAR) ×1
OBTURATOR OPTICAL STND 8 DVNC (TROCAR) ×2
OBTURATOR OPTICALSTD 8 DVNC (TROCAR) ×2 IMPLANT
PACK ROBOT GYN CUSTOM WL (TRAY / TRAY PROCEDURE) ×3 IMPLANT
PAD OB MATERNITY 4.3X12.25 (PERSONAL CARE ITEMS) ×2 IMPLANT
PAD POSITIONING PINK XL (MISCELLANEOUS) ×3 IMPLANT
PORT ACCESS TROCAR AIRSEAL 12 (TROCAR) ×2 IMPLANT
PORT ACCESS TROCAR AIRSEAL 5M (TROCAR) ×1
POUCH SPECIMEN RETRIEVAL 10MM (ENDOMECHANICALS) IMPLANT
SEAL CANN UNIV 5-8 DVNC XI (MISCELLANEOUS) ×8 IMPLANT
SEAL XI 5MM-8MM UNIVERSAL (MISCELLANEOUS) ×4
SET TRI-LUMEN FLTR TB AIRSEAL (TUBING) ×3 IMPLANT
SURGIFLO W/THROMBIN 8M KIT (HEMOSTASIS) IMPLANT
SUT VIC AB 0 CT1 27 (SUTURE)
SUT VIC AB 0 CT1 27XBRD ANTBC (SUTURE) IMPLANT
SUT VIC AB 2-0 UR5 27 (SUTURE) ×2 IMPLANT
SYR 10ML LL (SYRINGE) IMPLANT
TOWEL OR NON WOVEN STRL DISP B (DISPOSABLE) ×3 IMPLANT
TRAP SPECIMEN MUCOUS 40CC (MISCELLANEOUS) ×2 IMPLANT
TRAY FOLEY MTR SLVR 16FR STAT (SET/KITS/TRAYS/PACK) ×3 IMPLANT
UNDERPAD 30X30 (UNDERPADS AND DIAPERS) ×3 IMPLANT
WATER STERILE IRR 1000ML POUR (IV SOLUTION) ×3 IMPLANT
YANKAUER SUCT BULB TIP NO VENT (SUCTIONS) ×3 IMPLANT

## 2018-05-31 NOTE — Discharge Instructions (Signed)
Unilateral Salpingo-Oophorectomy, Care After °Refer to this sheet in the next few weeks. These instructions provide you with information on caring for yourself after your procedure. Your health care provider may also give you more specific instructions. Your treatment has been planned according to current medical practices, but problems sometimes occur. Call your health care provider if you have any problems or questions after your procedure. °What can I expect after the procedure? °After the procedure, it is typical to have the following: °· Abdominal pain that can be controlled with pain medicine. °· Vaginal spotting. °· Constipation. ° °Follow these instructions at home: °· Get plenty of rest and sleep. °· Only take over-the-counter or prescription medicines as directed by your health care provider. Do not take aspirin. It can cause bleeding. °· Keep incision areas clean and dry. Remove or change any bandages (dressings) only as directed by your health care provider. °· Follow your health care provider's advice regarding diet. °· Drink enough fluids to keep your urine clear or pale yellow. °· Limit exercise and activities as directed by your health care provider. Do not lift anything heavier than 5 pounds (2.3 kg) until your health care provider approves. °· Do not drive until your health care provider approves. °· Do not drink alcohol until your health care provider approves. °· Do not have sexual intercourse until your health care provider says it is OK. °· Take your temperature twice a day and write it down. °· If you become constipated, you may: °? Ask your health care provider about taking a mild laxative. °? Add more fruit and bran to your diet. °? Drink more fluids. °· Follow up with your health care provider as directed. °Contact a health care provider if: °· You have swelling or redness in the incision area. °· You develop a rash. °· You feel lightheaded. °· You have pain that is not controlled with  medicine. °· You have pain, swelling, or redness where the IV access tube was placed. °Get help right away if: °· You have a fever. °· You develop increasing abdominal pain. °· You see pus coming out of the incision, or the incision is separating. °· You notice a bad smell coming from the wound or dressing. °· You have excessive vaginal bleeding. °· You feel sick to your stomach (nauseous) and vomit. °· You have leg or chest pain. °· You have pain when you urinate. °· You develop shortness of breath. °· You pass out. °This information is not intended to replace advice given to you by your health care provider. Make sure you discuss any questions you have with your health care provider. °Document Released: 08/08/2009 Document Revised: 09/11/2016 Document Reviewed: 04/05/2013 °Elsevier Interactive Patient Education © 2018 [employer] ° °

## 2018-05-31 NOTE — Anesthesia Procedure Notes (Addendum)
Procedure Name: Intubation Date/Time: 05/31/2018 10:26 AM Performed by: Mitzie Na, CRNA Pre-anesthesia Checklist: Patient identified, Emergency Drugs available, Suction available, Patient being monitored and Timeout performed Patient Re-evaluated:Patient Re-evaluated prior to induction Oxygen Delivery Method: Circle system utilized Preoxygenation: Pre-oxygenation with 100% oxygen Induction Type: IV induction Ventilation: Mask ventilation without difficulty Laryngoscope Size: Mac and 4 Grade View: Grade I Tube type: Oral Tube size: 7.0 mm Number of attempts: 1 Airway Equipment and Method: Stylet Placement Confirmation: ETT inserted through vocal cords under direct vision,  positive ETCO2 and breath sounds checked- equal and bilateral Secured at: 23 cm Tube secured with: Tape Dental Injury: Teeth and Oropharynx as per pre-operative assessment

## 2018-05-31 NOTE — Addendum Note (Signed)
Addendum  created 05/31/18 1549 by Sadarius Norman L, CRNA   Attestation recorded in Intraprocedure, Intraprocedure Attestations filed    

## 2018-05-31 NOTE — Anesthesia Postprocedure Evaluation (Signed)
Anesthesia Post Note  Patient: Kathleen Watkins  Procedure(s) Performed: XI ROBOTIC ASSISTED LEFT SALPINGO OOPHORECTOMY AND RIGHT URETERAL LYSIS (Bilateral ) LYSIS OF ADHESION (N/A )     Patient location during evaluation: PACU Anesthesia Type: General Level of consciousness: awake and alert Pain management: pain level controlled Vital Signs Assessment: post-procedure vital signs reviewed and stable Respiratory status: spontaneous breathing, nonlabored ventilation, respiratory function stable and patient connected to nasal cannula oxygen Cardiovascular status: blood pressure returned to baseline and stable Postop Assessment: no apparent nausea or vomiting Anesthetic complications: no    Last Vitals:  Vitals:   05/31/18 1345 05/31/18 1400  BP: (!) 101/55 99/67  Pulse: 65 60  Resp: 16 15  Temp:    SpO2: 100% 100%    Last Pain:  Vitals:   05/31/18 1400  TempSrc:   PainSc: 7                  [name] S

## 2018-05-31 NOTE — Transfer of Care (Signed)
Immediate Anesthesia Transfer of Care Note  Patient: Kathleen Watkins  Procedure(s) Performed: XI ROBOTIC ASSISTED LEFT SALPINGO OOPHORECTOMY AND RIGHT URETERAL LYSIS (Bilateral ) LYSIS OF ADHESION (N/A )  Patient Location: PACU  Anesthesia Type:General  Level of Consciousness: awake, alert , oriented and patient cooperative  Airway & Oxygen Therapy: Patient Spontanous Breathing and Patient connected to face mask oxygen  Post-op Assessment: Report given to RN, Post -op Vital signs reviewed and stable and Patient moving all extremities  Post vital signs: Reviewed and stable  Last Vitals:  Vitals Value Taken Time  BP    Temp 36.7 C 05/31/2018 12:37 PM  Pulse 76 05/31/2018 12:42 PM  Resp 14 05/31/2018 12:42 PM  SpO2 100 % 05/31/2018 12:42 PM  Vitals shown include unvalidated device data.  Last Pain:  Vitals:   05/31/18 0832  TempSrc:   PainSc: 0-No pain      Patients Stated Pain Goal: 3 (05/31/18 [id number])  Complications: No apparent anesthesia complications

## 2018-05-31 NOTE — Interval H&P Note (Signed)
History and Physical Interval Note:  05/31/2018 9:38 AM  Kathleen Watkins  has presented today for surgery, with the diagnosis of LEFT OVARIAN CYST  The various methods of treatment have been discussed with the patient and family. After consideration of risks, benefits and other options for treatment, the patient has consented to  Procedure(s): LEFT SALPINGO OOPHORECTOMY (possible bilateral, possible hysterectomy) LYSIS OF ADHESION (N/A) as a surgical intervention .  The patient's history has been reviewed, patient examined, no change in status, stable for surgery.  I have reviewed the patient's chart and labs.  Questions were answered to the patient's satisfaction.     [name]

## 2018-05-31 NOTE — Op Note (Signed)
OPERATIVE NOTE  Date: 05/31/18  Preoperative Diagnosis: left ovarian cyst, morbid obesity   Postoperative Diagnosis:  Same, stage IV endometriosis  Procedure(s) Performed: Robotic-assisted laparoscopic left salpingo-oophorectomy, lysis of adhesions, left ureterolysis, resection of endometriosis implants (22 modifier for complexity due to morbid obesity and dense endometriosis infiltrating retroperitoneal on the left requiring left ureterolysis and adding an additional 1 hour of surgical procedure beyond the usual procedural length).   Surgeon: Adolphus Birchwood, M.D.  Assistant Surgeon: Antionette Char M.D. (an MD assistant was necessary for tissue manipulation, management of robotic instrumentation, retraction and positioning due to the complexity of the case and hospital policies).   Anesthesia: Gen. endotracheal.  Specimens: pelvic washings, left tube and ovary, right round ligament nodule  Estimated Blood Loss: 50 mL. Blood Replacement: None  Complications: none  Indication for Procedure:  Pelvic pain, persistent left ovarian cyst.  Operative Findings: 8cm endometrioma (deep) with dense thickened adhesions to left ovarian fossa, and tethered to left uterosacral ligament. Partial obliteration of cul de sac. 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 The left ovary was densely adherent to the posterior uterus. THere was a 1.5cm nodule of endometriosis on the right round ligament.   Frozen pathology was consistent with endometrioma  Procedure: The patient's taken to the operating room and placed under general endotracheal anesthesia testing difficulty. She is placed in a dorsolithotomy position and cervical acromial pad was placed. The arms were tucked with care taken to pad the olecranon process. And prepped and draped in usual sterile fashion. A uterine manipulator (zumi) was placed vaginally. A 5mm incision was made in  the left upper quadrant palmer's point and a 5 mm Optiview trocar used to enter the abdomen under direct visualization. With entry into the abdomen and then maintenance of 15 mm of mercury the patient was placed in Trendelenburg position. An incision was made in the umbilicus and a 10mm trochar was placed through this site. Two incisions were made lateral to the umbilical incision in the left and right abdomen measuring 8mm. These incisions were made approximately 10 cm lateral to the umbilical incision. 8 mm robotic trochars were inserted. The robot was docked.  The abdomen was inspected as was the pelvis.  Pelvic washings were obtained. For 30 minutes sharp dissection freed the left ovary from its attachments to the uterus, sigmoid colon and cul de sac.  Sharp dissection was employed to free the ovary from its tethered attacments to the uterosacral ligament. An incision was made on the left pelvic side wall peritoneum parallel to the IP ligament and the retroperitoneal space entered. The left ureter was identified and the para-rectal space was developed. The left ureter was drawn ventrally towards the ovarian vessels due to retroperitoneal fibrosis. For 30 minutes left ureterolysis was performed to mobilize the left ureter laterally off of the medial leaf of the broad ligament away from the left cyst. A window was created in the left broad ligament above the ureter. The left infundibulopelvic vessels were skeletonized cauterized and transected. The utero-ovarian ligaments similarly were cauterized and transected. Specimen was placed in an Endo Catch bag.  The right ovary was inspected. A 2cm simple cyst was observed and appeared physiologic. The right tube was grossly normal though somewhat adhesed to the side wall. The right round ligament was puckered and tethered in the mid portion with an implant of endometriosis which was skeletonized with the bovie and dissected free. The abdomen was copiously  irrigated and drained and all operative sites inspected and hemostasis was assured  The robot was undocked. The contents of the left Endo Catch bag were first aspirated and then morcellated to facilitate removal from the abdominal cavity via the port site wihtin the bag.  The ports were all removed. The fascial closure at the umbilical incision and left upper quadrant port was made with 0 Vicryl.  All incisions were closed with a running subcuticular Monocryl suture. Dermabond was applied.  A sponge out of the vagina revealed oozing frrom the anterior cervix where a tenaculum had been placed. This was made hemostatic with the bovie and with 2-0 vicryl suture.  Sponge, lap and needle counts were correct x 3.    The patient had sequential compression devices for VTE prophylaxis.         Disposition: PACU -stable         Condition: stable  Quinn Axeossi, Noora Locascio Caroline, MD

## 2018-05-31 NOTE — Anesthesia Preprocedure Evaluation (Addendum)
Anesthesia Evaluation  Patient identified by MRN, date of birth, ID band Patient awake    Reviewed: Allergy & Precautions, NPO status , Patient's Chart, lab work & pertinent test results  Airway Mallampati: II  TM Distance: >3 FB Neck ROM: Full    Dental no notable dental hx.    Pulmonary neg pulmonary ROS,    Pulmonary exam normal breath sounds clear to auscultation       Cardiovascular negative cardio ROS Normal cardiovascular exam Rhythm:Regular Rate:Normal     Neuro/Psych negative neurological ROS  negative psych ROS   GI/Hepatic negative GI ROS, Neg liver ROS,   Endo/Other  negative endocrine ROS  Renal/GU negative Renal ROS  negative genitourinary   Musculoskeletal negative musculoskeletal ROS (+)   Abdominal   Peds negative pediatric ROS (+)  Hematology negative hematology ROS (+)   Anesthesia Other Findings   Reproductive/Obstetrics negative OB ROS                             Anesthesia Physical Anesthesia Plan  ASA: II  Anesthesia Plan: General   Post-op Pain Management:    Induction: Intravenous  PONV Risk Score and Plan: 3 and Ondansetron, Dexamethasone, Treatment may vary due to age or medical condition and Midazolam  Airway Management Planned: Oral ETT  Additional Equipment:   Intra-op Plan:   Post-operative Plan: Extubation in OR  Informed Consent: I have reviewed the patients History and Physical, chart, labs and discussed the procedure including the risks, benefits and alternatives for the proposed anesthesia with the patient or authorized representative who has indicated his/her understanding and acceptance.     Dental advisory given  Plan Discussed with: CRNA and Surgeon  Anesthesia Plan Comments:         Anesthesia Quick Evaluation  

## 2018-06-01 ENCOUNTER — Encounter (HOSPITAL_COMMUNITY): Payer: Self-pay | Admitting: Gynecologic Oncology

## 2018-06-02 ENCOUNTER — Telehealth: Payer: Self-pay

## 2018-06-02 ENCOUNTER — Other Ambulatory Visit: Payer: Self-pay | Admitting: Gynecologic Oncology

## 2018-06-02 DIAGNOSIS — G8918 Other acute postprocedural pain: Secondary | ICD-10-CM

## 2018-06-02 MED ORDER — OXYCODONE HCL 5 MG PO TABS: 5.0000 mg | ORAL_TABLET | Freq: Four times a day (QID) | ORAL | 0 refills | Status: DC | PRN

## 2018-06-02 NOTE — Telephone Encounter (Signed)
Ms Kathleen Watkins states that her pain after taking 2 tablets of Percocet is down to a 0-1/10. Pain level is a 3-5 after one tablet.  She is not taking additional tylenol in between pain meds and cannot take NSAID due to gastric surgery. Pt given 10 tablets of Percocet after surgery on 05-31-18 Afebrile. Voiding well. She has a hard BM last evening and began stool softeners today. Suggested she take 2 bid as pain med increases constipation.. Pick up Miralax and use a capful today to help BM as this can add to her abdominal pain. She can take a capful 1-2 time daily if she has not had a good evacuation of stool.  Reviewed with [name], NP.  Told Ms Kathleen Watkins that She is being given 20 tablets of OxyIR 5 mg tabs to take 1-2 q 6 hrs prn severe pain. Medication sent in to pharmacy.  She needs to begin taking Tylenol 500 mg tablets 1-2 every 6 hrs.  Not to exceed 4000 mg in a 24 hr period. If the pain is not improving or gets worse she needs to call the office as a CT scan of the abdomen and pelvis would need to be done to evaluate her pain. Patient verbalized understanding.

## 2018-06-02 NOTE — Telephone Encounter (Signed)
Spoke with mother stating that her daughter called and left a message regarding refill on pain medication.  Attempting to reach her and her VM full and she is not answering the  Phone.  Mother to call her daughter and have her call the office .

## 2018-06-02 NOTE — Progress Notes (Signed)
See RN note about post-op pain °

## 2018-06-06 ENCOUNTER — Telehealth: Payer: Self-pay | Admitting: *Deleted

## 2018-06-06 NOTE — Telephone Encounter (Signed)
Called patient to let her know about her surgical pathology results.  Patient did not answer, left a voicemail on patient's cell phone to give our office a call back.

## 2018-06-09 ENCOUNTER — Telehealth: Payer: Self-pay

## 2018-06-09 NOTE — Telephone Encounter (Addendum)
Outgoing call per [name] NP regarding recent surgical path report "benign" "How is she?".  Pt voiced understanding.  She reported her incisions are a little red "like a mosquito bite and itchy."  Denies drainage.  Notified [name] NP.  Per her, 'let patient know this is most likely due to sensitivity to the glue, patient can use hydrocortisone on the skin around incision, can take over the counter Benadryl as directed, and after 2 more days, can apply neosporin to sites to help glue come off sooner.'  I also reminded her to keep in touch with office regarding any other symptoms ie fever, drainage, or symptoms not improving.  Pt voiced understanding.  Reminded her of upcoming appt with Dr Andrey Farmerossi on 9-4 and let her know if we can schedule her sooner if needs to be seen.  No other needs per pt at this time.

## 2018-06-13 ENCOUNTER — Inpatient Hospital Stay: Payer: Medicaid Other | Attending: Gynecologic Oncology | Admitting: Obstetrics

## 2018-06-13 ENCOUNTER — Telehealth: Payer: Self-pay

## 2018-06-13 ENCOUNTER — Inpatient Hospital Stay: Payer: Medicaid Other

## 2018-06-13 ENCOUNTER — Encounter: Payer: Self-pay | Admitting: Obstetrics

## 2018-06-13 VITALS — BP 129/87 | HR 98 | Temp 98.0°F | Resp 20 | Ht 67.5 in | Wt 305.0 lb

## 2018-06-13 DIAGNOSIS — N939 Abnormal uterine and vaginal bleeding, unspecified: Secondary | ICD-10-CM

## 2018-06-13 DIAGNOSIS — G8918 Other acute postprocedural pain: Secondary | ICD-10-CM | POA: Insufficient documentation

## 2018-06-13 LAB — HEMOGLOBIN AND HEMATOCRIT (CANCER CENTER ONLY)
HEMATOCRIT: 32.7 % — AB (ref 34.8–46.6)
HEMOGLOBIN: 11.5 g/dL — AB (ref 11.6–15.9)

## 2018-06-13 MED ORDER — OXYCODONE HCL 5 MG PO TABS: 5.0000 mg | ORAL_TABLET | Freq: Four times a day (QID) | ORAL | 0 refills | Status: AC | PRN

## 2018-06-13 NOTE — Progress Notes (Signed)
S: Asked to see patient with heavy vaginal bleeding postop. Greater than her usual menstrual volume. Also some cramping with the bleeding for which she is using too much Tylenol by report. Mild dizziness. States for 2 days after surgery just had spotting then started to have heavy bleeding for the past 3 days. States usually she doesn't leak through but she is using large volume pads and soaking through every 1-2 hours.  O:  Vitals:   06/13/18 1343  BP: 129/87  Pulse: 98  Resp: 20  Temp: 98 F (36.7 C)  SpO2: 98%   Pelvic : blood covering inner thighs. No palpable area of concern on gentle digital exam.Speculum, no active bleeding. Dark blood and stitch seen at 12:00 vaginal fornix. No active bleeding from this area but the area has a recollection of dark blood when I reposition the speculum. Ectocervix normal. Some slight bleeding from os, also not active. Monsel's applied to 12:00 region. She is a difficult exam due to her obesity.  A/P: Heavy vaginal bleeding postop versus menses. On exam this may be due to a combination of the tenaculum placement and her menstrual cycle coming earlier than expected due to the recent ovarian manipulation.  There is some collection near the stitch and so if the Monsel's doesn't help slow things down she may need an EUA for a better look. I will order an H/H today. Refill oxycodone for ~5 day supply Keep followup with Dr. Andrey Farmerossi as scheduled pending outcome of above.

## 2018-06-13 NOTE — Telephone Encounter (Signed)
Told Ms Kathleen Watkins that Dr. Doroteo GlassmanPhelps said that her H&H were slightly down from 2 weeks ago but fine. Pt to call if bleeding increases. Pt verbalized understanding.

## 2018-06-13 NOTE — Patient Instructions (Signed)
Will call you with the results of your blood counts. Call the office if bleeding increases.

## 2018-06-13 NOTE — Telephone Encounter (Signed)
Pt continuing with heaving bleeding that began 3 days ago.  Pt a week early for menstrual cycle.She is going through a thick pad every 1 1/2 to 2 hrs. Blood gushes at times. Passing clots. The cramping pain is a 8/10  Taking tylenol 1000 mg every 4 hours.  Told Ms Pollie MeyerMcIntyre that that was to much tylenol.  She should not exceed 4000 mg in a 24 hour period.  2 every six hours is the most she should take. She had a couple of OxyIr 5 mg left from surgery on 8-6.  One tablet helped to bring the pain to a 4-5/10.  She is out of the Oxy IR. Reviewed with Melissa Cross,NP. Pt to come in to be evaluated with Dr. Doroteo GlassmanPhelps at 1:15 pm.

## 2018-06-14 ENCOUNTER — Telehealth: Payer: Self-pay

## 2018-06-14 NOTE — Telephone Encounter (Signed)
Outgoing call to patient per [name] NP, regarding how is her bleeding today?   No answer, left her a VM requesting a call back to our office.

## 2018-06-15 NOTE — Telephone Encounter (Signed)
Ms Kathleen Watkins stated that the bleedinghas significantly decreased. The bleeding is light as her period is ending. She only had to use 2 OxyIR tablets since Monday. Pt to keep appointment with Dr. Andrey Farmerossi as scheduled.

## 2018-06-29 ENCOUNTER — Inpatient Hospital Stay: Payer: Medicaid Other | Attending: Gynecologic Oncology | Admitting: Gynecologic Oncology

## 2018-06-29 NOTE — Progress Notes (Signed)
Pt did not show for her post op appointment today with Dr. Andrey Farmer.   Pt seen 06-13-18 by Dr. Doroteo Glassman for vaginal bleeding.   Followed up with Ms Verhaeghe on 06-15-18.  Bleeding and pain was decreasing. Reviewed with Dr. Andrey Farmer and she stated that patinet had a benign problem and a follow up call was not necessary as she does not need any further follow up with Gyn Onc.

## 2019-02-23 ENCOUNTER — Encounter: Payer: Self-pay | Admitting: *Deleted

## 2020-03-06 ENCOUNTER — Inpatient Hospital Stay (HOSPITAL_BASED_OUTPATIENT_CLINIC_OR_DEPARTMENT_OTHER): Admission: RE | Admit: 2020-03-06 | Payer: Medicaid Other | Source: Ambulatory Visit

## 2020-03-06 DIAGNOSIS — Z1231 Encounter for screening mammogram for malignant neoplasm of breast: Secondary | ICD-10-CM

## 2020-04-15 IMAGING — US US ART/VEN ABD/PELV/SCROTUM DOPPLER LTD
1 series · 13 of 25 positions shown · non-contrast
Comparison: Pelvic ultrasound 05/24/2017 and 02/25/2016.

CLINICAL DATA: History of endometriosis.  Pelvic pain.

EXAM:
TRANSABDOMINAL AND TRANSVAGINAL ULTRASOUND OF PELVIS
DOPPLER ULTRASOUND OF OVARIES
TECHNIQUE: Both transabdominal and transvaginal ultrasound examinations of the
pelvis were performed. Transabdominal technique was performed for
global imaging of the pelvis including uterus, ovaries, adnexal
regions, and pelvic cul-de-sac.
It was necessary to proceed with endovaginal exam following the
transabdominal exam to visualize the endometrium and adnexa. Color
and duplex Doppler ultrasound was utilized to evaluate blood flow to
the ovaries.

[Series 1: us art/ven abd/pelv/scrotum doppler ltd · 0.20mm/px · 13 of 131 slices shown]
[im 1/131]
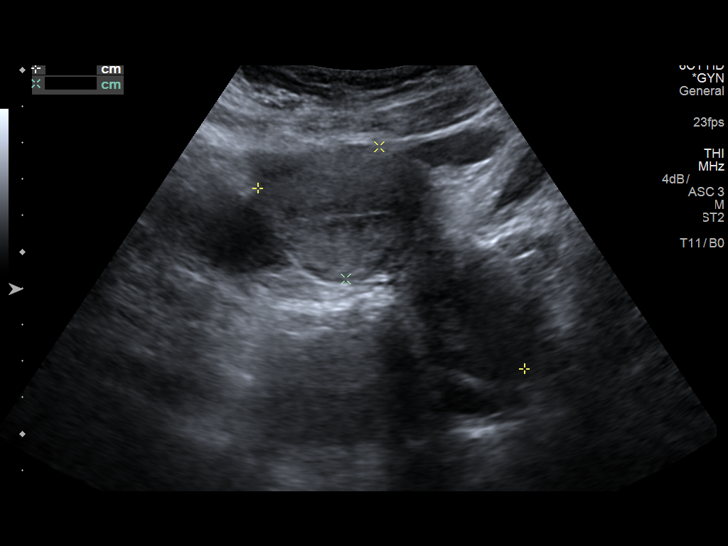
[im 11/131]
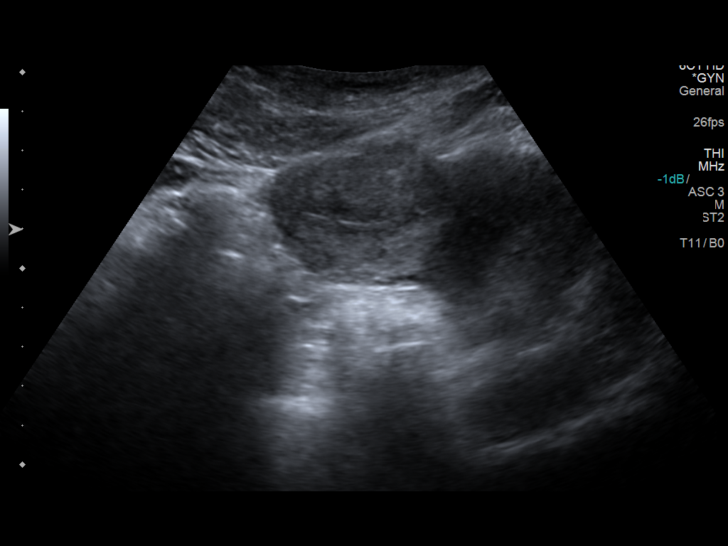
[im 22/131]
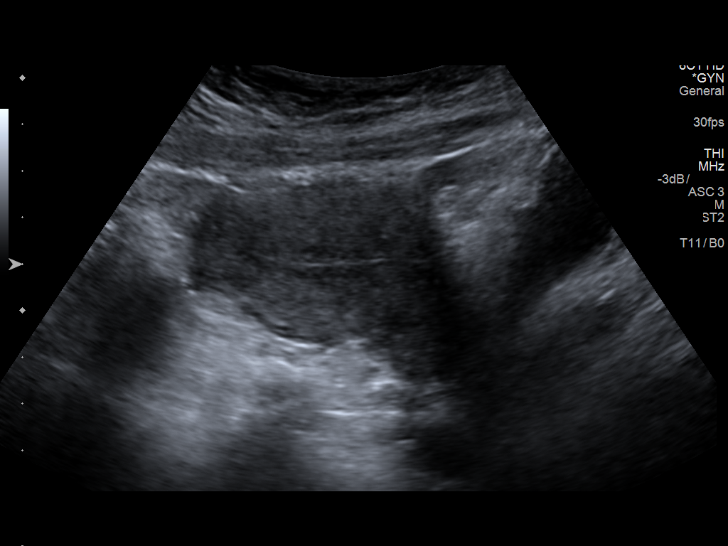
[im 33/131]
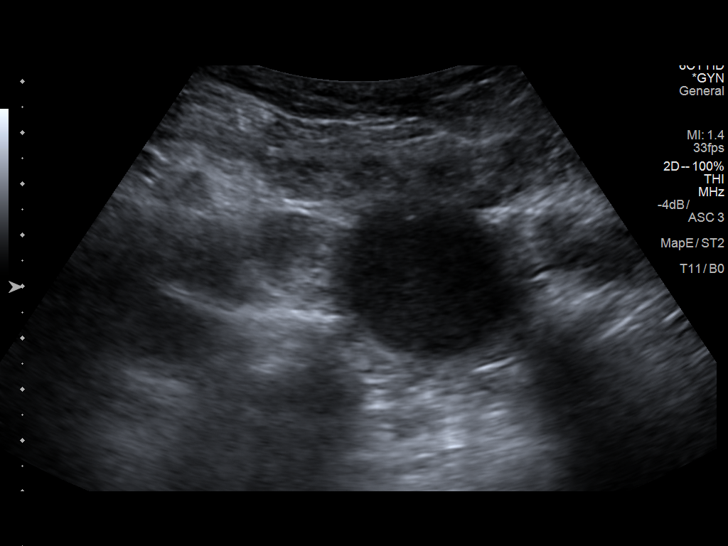
[im 44/131]
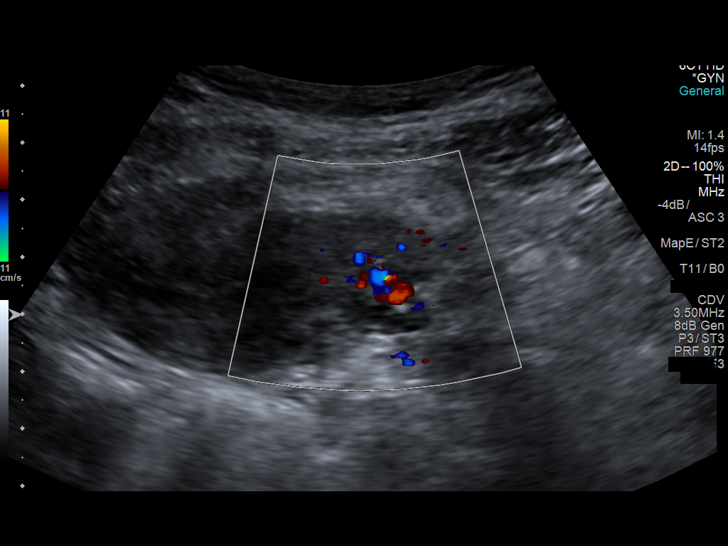
[im 55/131]
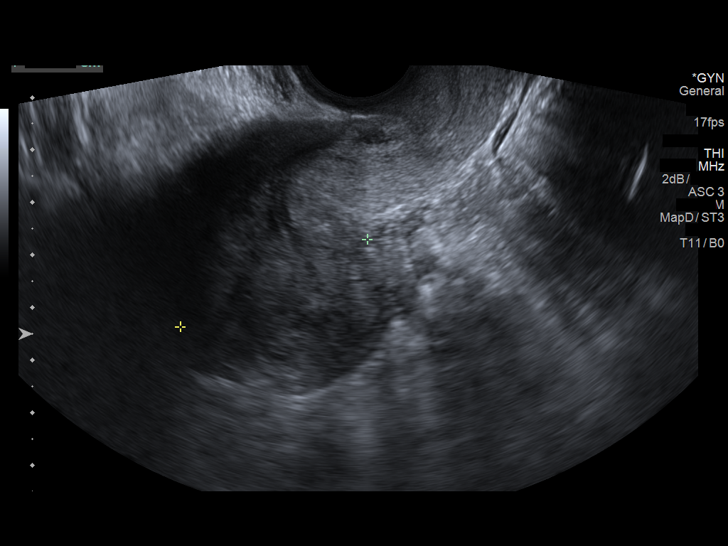
[im 66/131]
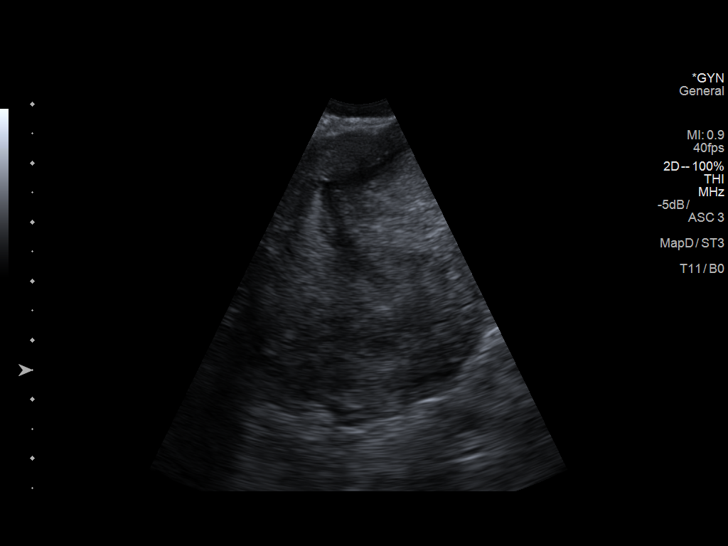
[im 76/131]
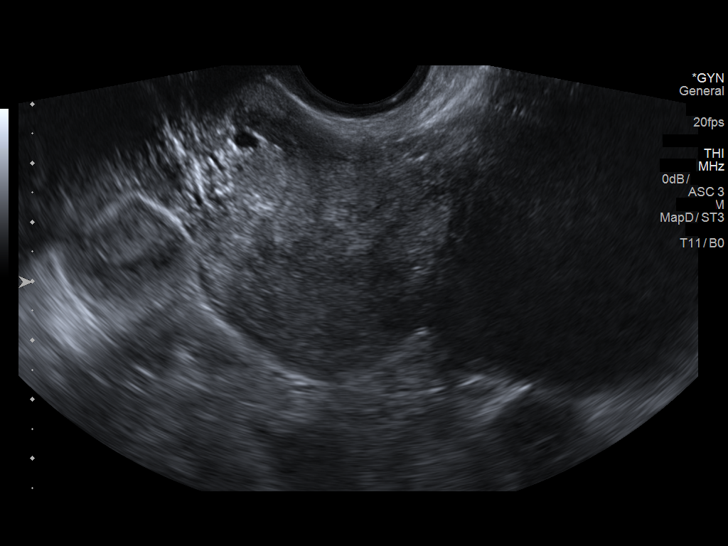
[im 87/131]
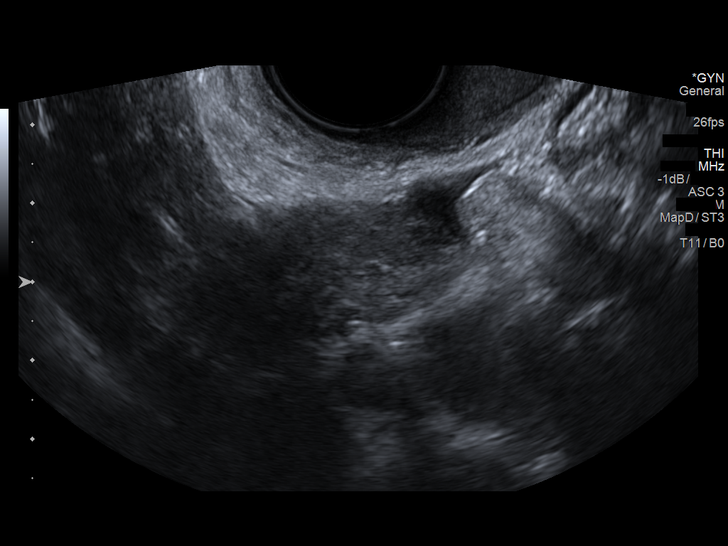
[im 98/131]
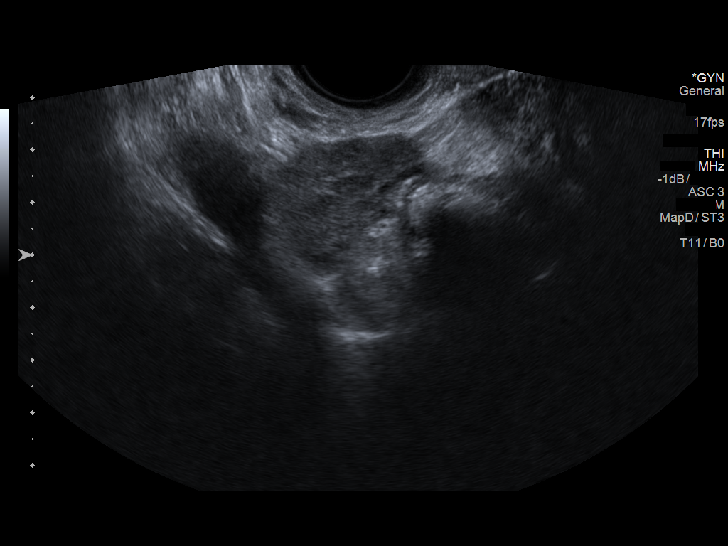
[im 109/131]
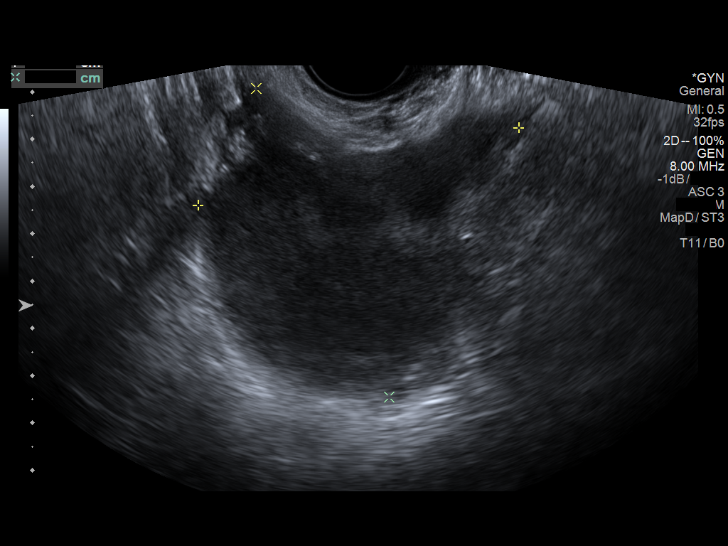
[im 120/131]
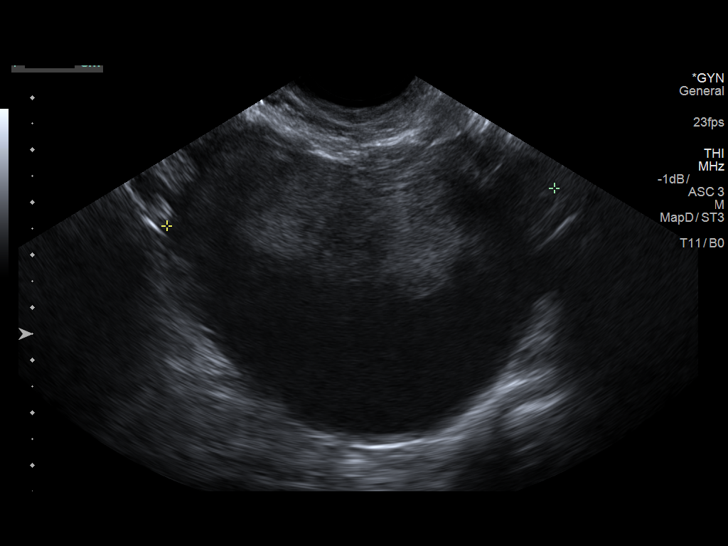
[im 131/131]
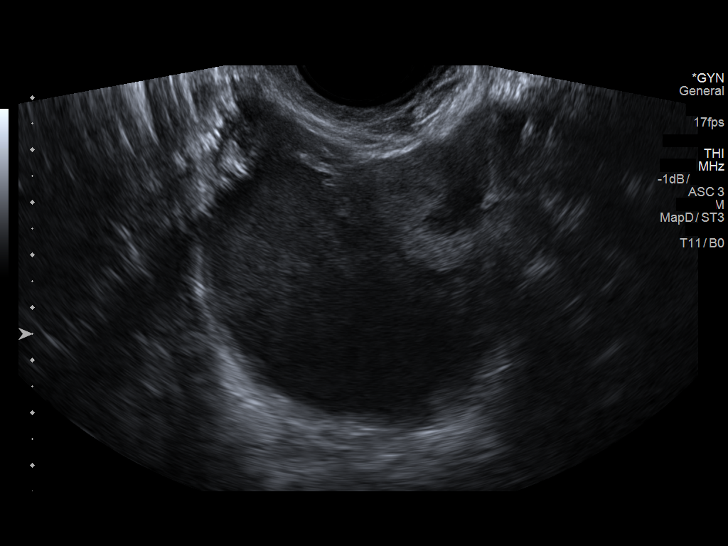

[13 of 25 positions shown; findings below may reference images not displayed]

FINDINGS: Uterus

Measurements: 10.2 x 3.9 x 4.5 cm. No fibroids or other mass
visualized.

Endometrium

Thickness: 0.8 cm.  No focal abnormality visualized.

Right ovary

Measurements: 3.8 x 2.2 x 3.4 cm. Normal appearance/no adnexal mass.

Left ovary

Measurements: 7.6 x 4.8 x 6.6 cm. Mixed echogenicity lesion
measuring 7.2 x 4.4 x 6.4 cm is identified as seen on the prior
examinations. Its appearance is not markedly changed. There is no
flow within the lesion on Doppler imaging.

Pulsed Doppler evaluation of both ovaries demonstrates normal
low-resistance arterial and venous waveforms.

Other findings

No abnormal free fluid.
IMPRESSION: No acute finding.

No change in a 7.2 cm in diameter lesion in the left ovary likely
representing an endometrioma.

## 2020-05-15 ENCOUNTER — Other Ambulatory Visit: Payer: Self-pay

## 2020-05-15 ENCOUNTER — Encounter (HOSPITAL_BASED_OUTPATIENT_CLINIC_OR_DEPARTMENT_OTHER): Payer: Self-pay | Admitting: Emergency Medicine

## 2020-05-15 ENCOUNTER — Emergency Department (HOSPITAL_BASED_OUTPATIENT_CLINIC_OR_DEPARTMENT_OTHER): Payer: Medicaid Other

## 2020-05-15 ENCOUNTER — Emergency Department (HOSPITAL_BASED_OUTPATIENT_CLINIC_OR_DEPARTMENT_OTHER)
Admission: EM | Admit: 2020-05-15 | Discharge: 2020-05-15 | Disposition: A | Discharge status: Home / Self Care | Payer: Medicaid Other | Attending: Emergency Medicine | Admitting: Emergency Medicine

## 2020-05-15 DIAGNOSIS — F1590 Other stimulant use, unspecified, uncomplicated: Secondary | ICD-10-CM | POA: Insufficient documentation

## 2020-05-15 DIAGNOSIS — R0789 Other chest pain: Secondary | ICD-10-CM | POA: Diagnosis present

## 2020-05-15 DIAGNOSIS — R202 Paresthesia of skin: Secondary | ICD-10-CM | POA: Diagnosis not present

## 2020-05-15 DIAGNOSIS — R079 Chest pain, unspecified: Secondary | ICD-10-CM

## 2020-05-15 LAB — BASIC METABOLIC PANEL
Anion gap: 9 (ref 5–15)
BUN: 13 mg/dL (ref 6–20)
CO2: 26 mmol/L (ref 22–32)
Calcium: 8.5 mg/dL — ABNORMAL LOW (ref 8.9–10.3)
Chloride: 105 mmol/L (ref 98–111)
Creatinine, Ser: 0.74 mg/dL (ref 0.44–1.00)
GFR calc Af Amer: >60 mL/min (ref >60)
GFR calc non Af Amer: >60 mL/min (ref >60)
Glucose, Bld: 106 mg/dL — ABNORMAL HIGH (ref 70–99)
Potassium: 3.8 mmol/L (ref 3.5–5.1)
Sodium: 140 mmol/L (ref 135–145)

## 2020-05-15 LAB — CBC WITH DIFFERENTIAL/PLATELET
Abs Immature Granulocytes: 0.01 10*3/uL (ref 0.00–0.07)
Basophils Absolute: 0 10*3/uL (ref 0.0–0.1)
Basophils Relative: 1 %
Eosinophils Absolute: 0.2 10*3/uL (ref 0.0–0.5)
Eosinophils Relative: 4 %
HCT: 33.7 % — ABNORMAL LOW (ref 36.0–46.0)
Hemoglobin: 11.5 g/dL — ABNORMAL LOW (ref 12.0–15.0)
Immature Granulocytes: 0 %
Lymphocytes Relative: 44 %
Lymphs Abs: 1.6 10*3/uL (ref 0.7–4.0)
MCH: 25.3 pg — ABNORMAL LOW (ref 26.0–34.0)
MCHC: 34.1 g/dL (ref 30.0–36.0)
MCV: 74.1 fL — ABNORMAL LOW (ref 80.0–100.0)
Monocytes Absolute: 0.5 10*3/uL (ref 0.1–1.0)
Monocytes Relative: 12 %
Neutro Abs: 1.5 10*3/uL — ABNORMAL LOW (ref 1.7–7.7)
Neutrophils Relative %: 39 %
Platelets: 300 10*3/uL (ref 150–400)
RBC: 4.55 MIL/uL (ref 3.87–5.11)
RDW: 15.5 % (ref 11.5–15.5)
WBC: 3.8 10*3/uL — ABNORMAL LOW (ref 4.0–10.5)
nRBC: 0 % (ref 0.0–0.2)

## 2020-05-15 LAB — CBG MONITORING, ED: Glucose-Capillary: 87 mg/dL (ref 70–99)

## 2020-05-15 MED ORDER — PREGABALIN 25 MG PO CAPS: 25.0000 mg | ORAL_CAPSULE | Freq: Two times a day (BID) | ORAL | 0 refills | Status: AC

## 2020-05-15 MED ORDER — LACTATED RINGERS IV BOLUS
1000.0000 mL | Freq: Once | INTRAVENOUS | Status: AC
  Administered 2020-05-15: 1000 mL via INTRAVENOUS

## 2020-05-15 MED FILL — PREGABALIN 25 MG CAPS: 25 | 30 days supply | Qty: 60 | Fill #0

## 2020-05-15 NOTE — ED Triage Notes (Signed)
Patient arrived via POV c/o sharp pain with deep breath in center chest with tingling down both arms after working at desk. Patient is AO x 4, VS WDL, Normal gait.

## 2020-05-15 NOTE — Discharge Instructions (Addendum)
It was our pleasure to provide your ER care today - we hope that you feel better.  Follow up with primary care doctor in the coming week.  Return to ER if worse, new symptoms, fevers, chest pain, trouble breathing, or other concern.

## 2020-05-15 NOTE — ED Provider Notes (Signed)
Signed out at 0730 by Dr Clayborne Dana, that d/c instructions are done, and that patient to be d/c'd home when cbc/chem resulted.   CBC c/w baseline. Chem unremarkable.   Rec pcp f/u.  Return precautions provided.      Cathren Laine, MD 05/15/20 [phone]

## 2020-05-15 NOTE — ED Notes (Signed)
ED Provider at bedside. 

## 2020-05-27 NOTE — ED Provider Notes (Signed)
[facility] EMERGENCY DEPARTMENT Provider Note   CSN: [id number] Arrival date & time: 05/15/20  1191     History Chief Complaint  Patient presents with  . Chest Pain    Kathleen Watkins is a 34 y.o. female.   Chest Pain Pain location:  R chest Pain quality: sharp and stabbing   Pain radiates to:  Does not radiate Pain severity:  Mild Duration:  1 day Timing:  Constant Chronicity:  New Context: not breathing   Relieved by:  Nothing Worsened by:  Nothing Ineffective treatments:  None tried Associated symptoms: no abdominal pain, no dizziness and no dysphagia        Past Medical History:  Diagnosis Date  . Anemia   . Bilateral ovarian cysts   . Endometriosis   . Obese   . Sleep apnea    HX of no cpap  . STD (female)    Trich-tx'd in 11/2010,   . Umbilical hernia     Patient Active Problem List   Diagnosis Date Noted  . Retroperitoneal fibrosis   . Endometriosis 06/23/2017  . Bilateral ovarian cysts 06/23/2017  . Menorrhagia 09/23/2011  . Pedal edema 09/23/2011  . 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 PPH (postpartum hemorrhage) 07/02/2011  . Morbid obesity (HCC) 06/24/2011  . History of gestational diabetes 06/24/2011  . 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 ADENOIDECTOMY    . CESAREAN SECTION    . CESAREAN SECTION  08/10/2011   Procedure: CESAREAN SECTION;  Surgeon: Tereso Newcomer, MD;  Location: [facility] ORS;  Service: Gynecology;  Laterality: N/A;  . CYSTECTOMY    . DILATION AND CURETTAGE OF UTERUS  2007  . laparoscopic gastric sleeve surgery    . LYSIS OF ADHESION N/A 05/31/2018   Procedure: LYSIS OF ADHESION;  Surgeon: Adolphus Birchwood, MD;  Location: [facility] ORS;  Service: Gynecology;  Laterality: N/A;  . naval endometriois    . ROBOTIC ASSISTED TOTAL HYSTERECTOMY WITH BILATERAL SALPINGO OOPHERECTOMY Bilateral 05/31/2018   Procedure: XI ROBOTIC ASSISTED LEFT SALPINGO  OOPHORECTOMY AND RIGHT URETERAL LYSIS;  Surgeon: Adolphus Birchwood, MD;  Location: [facility] ORS;  Service: Gynecology;  Laterality: Bilateral;  . stent left groin after c section due to hemmorhage    . TONSILLECTOMY    . TOOTH EXTRACTION    . uterine ateriography  12/21/2005   bilateral     OB History    Gravida  3   Para  2   Term  2   Preterm  0   AB  1   Living  2     SAB  0   TAB  1   Ectopic  0   Multiple  0   Live Births  2           Family History  Problem Relation Age of Onset  . Diabetes Maternal Grandfather   . Hypertension Father   . Cancer Maternal Grandmother     Social History   Tobacco Use  . Smoking status: Never Smoker  . Smokeless tobacco: Never Used  Vaping Use  . Vaping Use: Never used  Substance Use Topics  . Alcohol use: Not Currently    Comment: occasional  . Drug use: Yes    Types: Marijuana    Comment: History of marijuana use in past, none in 3 months    Home Medications Prior to Admission medications   Medication Sig Start Date End  Date Taking? Authorizing Provider  acetaminophen (TYLENOL) 500 MG tablet Take 1,000 mg by mouth every 6 (six) hours as needed (for pain.).     [provider]  calcium citrate-vitamin D (CELEBRATE CALCIUM CITRATE) 500-500 MG-UNIT chewable tablet Chew 1 tablet by mouth 2 (two) times daily.    [provider]  Etonogestrel (NEXPLANON [place]) Inject into the skin.    [provider]  Multiple Vitamins-Minerals (CELEBRATE MULTI-COMPLETE 60) CHEW Chew 1 tablet by mouth 2 (two) times daily.    [provider]  oxyCODONE (OXY IR/ROXICODONE) 5 MG immediate release tablet Take 1 tablet (5 mg total) by mouth every 6 (six) hours as needed for severe pain. 06/13/18   Cross, Efraim Kaufmann D, NP  pregabalin (LYRICA) 25 MG capsule Take 1 capsule (25 mg total) by mouth 2 (two) times daily. 05/15/20   Kenly Henckel, Barbara Cower, MD  vitamin B-12 (CYANOCOBALAMIN) 1000 MCG tablet Take 1,000 mcg by mouth daily.  Celebrate Vitamin B-12 Quick-melt    [provider]    Allergies    Patient has no known allergies.  Review of Systems   Review of Systems  HENT: Negative for trouble swallowing.   Cardiovascular: Positive for chest pain.  Gastrointestinal: Negative for abdominal pain.  Neurological: Negative for dizziness.  All other systems reviewed and are negative.   Physical Exam Updated Vital Signs BP (!) 110/57   Pulse 89   Temp 98.7 F (37.1 C) (Oral)   Resp 16   Ht 5\' 7"  (1.702 m)   Wt (!) 165 kg   LMP 05/13/2020 (Exact Date)   SpO2 100%   BMI 56.96 kg/m   Physical Exam Vitals and nursing note reviewed.  Constitutional:      Appearance: She is well-developed.  HENT:     Head: Normocephalic and atraumatic.     Nose: No congestion or rhinorrhea.     Mouth/Throat:     Mouth: Mucous membranes are moist.     Pharynx: Oropharynx is clear.  Eyes:     Pupils: Pupils are equal, round, and reactive to light.  Cardiovascular:     Rate and Rhythm: Normal rate and regular rhythm.  Pulmonary:     Effort: No respiratory distress.     Breath sounds: No stridor.  Abdominal:     General: There is no distension.  Musculoskeletal:        General: No swelling or tenderness. Normal range of motion.     Cervical back: Normal range of motion.  Skin:    General: Skin is warm and dry.  Neurological:     General: No focal deficit present.     Mental Status: She is alert.     ED Results / Procedures / Treatments   Labs (all labs ordered are listed, but only abnormal results are displayed) Labs Reviewed  CBC WITH DIFFERENTIAL/PLATELET - Abnormal; Notable for the following components:      Result Value   WBC 3.8 (*)    Hemoglobin 11.5 (*)    HCT 33.7 (*)    MCV 74.1 (*)    MCH 25.3 (*)    Neutro Abs 1.5 (*)    All other components within normal limits  BASIC METABOLIC PANEL - Abnormal; Notable for the following components:   Glucose, Bld 106 (*)    Calcium 8.5 (*)     All other components within normal limits  CBG MONITORING, ED    EKG EKG Interpretation  Date/Time:  Wednesday May 15 2020 06:29:36 EDT Ventricular  Rate:  91 PR Interval:    QRS Duration: 88 QT Interval:  372 QTC Calculation: 458 R Axis:   35 Text Interpretation: Sinus rhythm No old tracing to compare Confirmed by Marily Memos [phone]) on 05/15/2020 6:48:39 AM   Radiology No results found.  Procedures Procedures (including critical care time)  Medications Ordered in ED Medications  lactated ringers bolus 1,000 mL ( Intravenous Stopped 05/15/20 0845)    ED Course  I have reviewed the triage vital signs and the nursing notes.  Pertinent labs & imaging results that were available during my care of the patient were reviewed by me and considered in my medical decision making (see chart for details).    MDM Rules/Calculators/A&P                          Low suspicion for cardiac causes with low heart score. Likely anxiety related vs msk related. Will check basic labs but likely discharge with outpatient follo wup. Final Clinical Impression(s) / ED Diagnoses Final diagnoses:  Nonspecific chest pain  Paresthesia    Rx / DC Orders ED Discharge Orders         Ordered    pregabalin (LYRICA) 25 MG capsule  2 times daily     Discontinue  Reprint     05/15/20 0742           Cloey Sferrazza, Barbara Cower, MD 05/27/20 [phone]

## 2020-07-29 DIAGNOSIS — N946 Dysmenorrhea, unspecified: Secondary | ICD-10-CM | POA: Insufficient documentation

## 2020-07-29 DIAGNOSIS — F32A Depression, unspecified: Secondary | ICD-10-CM | POA: Insufficient documentation

## 2020-07-29 DIAGNOSIS — K429 Umbilical hernia without obstruction or gangrene: Secondary | ICD-10-CM | POA: Insufficient documentation

## 2020-07-29 DIAGNOSIS — N80109 Endometriosis of ovary, unspecified side, unspecified depth: Secondary | ICD-10-CM | POA: Insufficient documentation

## 2020-07-29 DIAGNOSIS — M25561 Pain in right knee: Secondary | ICD-10-CM | POA: Insufficient documentation

## 2020-07-29 DIAGNOSIS — Z634 Disappearance and death of family member: Secondary | ICD-10-CM | POA: Insufficient documentation

## 2020-07-29 DIAGNOSIS — M549 Dorsalgia, unspecified: Secondary | ICD-10-CM | POA: Insufficient documentation

## 2020-07-29 DIAGNOSIS — F410 Panic disorder [episodic paroxysmal anxiety] without agoraphobia: Secondary | ICD-10-CM | POA: Insufficient documentation

## 2020-08-16 ENCOUNTER — Other Ambulatory Visit: Payer: Self-pay | Admitting: Nurse Practitioner

## 2020-08-27 ENCOUNTER — Other Ambulatory Visit: Payer: Medicaid Other

## 2021-04-13 ENCOUNTER — Emergency Department (HOSPITAL_BASED_OUTPATIENT_CLINIC_OR_DEPARTMENT_OTHER): Payer: BC Managed Care – PPO

## 2021-04-13 ENCOUNTER — Encounter (HOSPITAL_BASED_OUTPATIENT_CLINIC_OR_DEPARTMENT_OTHER): Payer: Self-pay | Admitting: Emergency Medicine

## 2021-04-13 ENCOUNTER — Other Ambulatory Visit: Payer: Self-pay

## 2021-04-13 ENCOUNTER — Emergency Department (HOSPITAL_BASED_OUTPATIENT_CLINIC_OR_DEPARTMENT_OTHER)
Admission: EM | Admit: 2021-04-13 | Discharge: 2021-04-13 | Disposition: A | Discharge status: Home / Self Care | Payer: BC Managed Care – PPO | Attending: Emergency Medicine | Admitting: Emergency Medicine

## 2021-04-13 DIAGNOSIS — M79601 Pain in right arm: Secondary | ICD-10-CM | POA: Diagnosis not present

## 2021-04-13 DIAGNOSIS — R000 Tachycardia, unspecified: Secondary | ICD-10-CM | POA: Diagnosis not present

## 2021-04-13 DIAGNOSIS — R079 Chest pain, unspecified: Secondary | ICD-10-CM | POA: Diagnosis not present

## 2021-04-13 DIAGNOSIS — M79602 Pain in left arm: Secondary | ICD-10-CM | POA: Diagnosis not present

## 2021-04-13 LAB — CBC WITH DIFFERENTIAL/PLATELET
Abs Immature Granulocytes: 0.01 10*3/uL (ref 0.00–0.07)
Basophils Absolute: 0 10*3/uL (ref 0.0–0.1)
Basophils Relative: 0 %
Eosinophils Absolute: 0.2 10*3/uL (ref 0.0–0.5)
Eosinophils Relative: 4 %
HCT: 34.4 % — ABNORMAL LOW (ref 36.0–46.0)
Hemoglobin: 12 g/dL (ref 12.0–15.0)
Immature Granulocytes: 0 %
Lymphocytes Relative: 40 %
Lymphs Abs: 1.9 10*3/uL (ref 0.7–4.0)
MCH: 25.3 pg — ABNORMAL LOW (ref 26.0–34.0)
MCHC: 34.9 g/dL (ref 30.0–36.0)
MCV: 72.4 fL — ABNORMAL LOW (ref 80.0–100.0)
Monocytes Absolute: 0.4 10*3/uL (ref 0.1–1.0)
Monocytes Relative: 9 %
Neutro Abs: 2.2 10*3/uL (ref 1.7–7.7)
Neutrophils Relative %: 47 %
Platelets: 313 10*3/uL (ref 150–400)
RBC: 4.75 MIL/uL (ref 3.87–5.11)
RDW: 16.4 % — ABNORMAL HIGH (ref 11.5–15.5)
WBC: 4.7 10*3/uL (ref 4.0–10.5)
nRBC: 0 % (ref 0.0–0.2)

## 2021-04-13 LAB — COMPREHENSIVE METABOLIC PANEL
ALT: 12 U/L (ref 0–44)
AST: 15 U/L (ref 15–41)
Albumin: 3.5 g/dL (ref 3.5–5.0)
Alkaline Phosphatase: 84 U/L (ref 38–126)
Anion gap: 5 (ref 5–15)
BUN: 16 mg/dL (ref 6–20)
CO2: 27 mmol/L (ref 22–32)
Calcium: 8.9 mg/dL (ref 8.9–10.3)
Chloride: 105 mmol/L (ref 98–111)
Creatinine, Ser: 0.77 mg/dL (ref 0.44–1.00)
GFR, Estimated: >60 mL/min (ref >60)
Glucose, Bld: 117 mg/dL — ABNORMAL HIGH (ref 70–99)
Potassium: 3.7 mmol/L (ref 3.5–5.1)
Sodium: 137 mmol/L (ref 135–145)
Total Bilirubin: 0.4 mg/dL (ref 0.3–1.2)
Total Protein: 7.7 g/dL (ref 6.5–8.1)

## 2021-04-13 LAB — D-DIMER, QUANTITATIVE: D-Dimer, Quant: 0.31 ug/mL-FEU (ref 0.00–0.50)

## 2021-04-13 LAB — TROPONIN I (HIGH SENSITIVITY): Troponin I (High Sensitivity): <2 ng/L (ref <18)

## 2021-04-13 MED ORDER — LACTATED RINGERS IV BOLUS
1000.0000 mL | Freq: Once | INTRAVENOUS | Status: AC
  Administered 2021-04-13: 1000 mL via INTRAVENOUS

## 2021-04-13 MED ORDER — KETOROLAC TROMETHAMINE 15 MG/ML IJ SOLN
15.0000 mg | Freq: Once | INTRAMUSCULAR | Status: AC
  Administered 2021-04-13: 15 mg via INTRAVENOUS
  Filled 2021-04-13: qty 1

## 2021-04-13 NOTE — ED Triage Notes (Signed)
Pt arrives pov with c/o midsternal CP radiating to left arm, describes as "shock" x 4 days. Pt denies N/V. Pt endorses hx of anxiety. Pt denies shob, also reports back pain, described as "achy"

## 2021-04-13 NOTE — ED Notes (Signed)
ED Provider at bedside. 

## 2021-04-13 NOTE — Discharge Instructions (Addendum)
There is no sign of heart attack, blood clots or enlargement of your heart today.  X-ray and blood work was all normal.  It is fine to take Tylenol or ibuprofen as needed for discomfort.

## 2021-04-13 NOTE — ED Provider Notes (Signed)
[facility] EMERGENCY DEPARTMENT Provider Note   CSN: [id number] Arrival date & time: 04/13/21  0756     History Chief Complaint  Patient presents with   Chest Pain    Kathleen Watkins is a 35 y.o. female.  Patient is a 35 year old female with a history of endometriosis, prior gastric sleeve, anemia and morbid obesity who is presenting today with complaint of chest and arm pain.  Patient states for the last 3 or 4 days now she has had these shock like sharp chest pains mostly through the center of her chest that last 1 to 2 minutes at a time and did not seem to be precipitated by anything.  They can occur while she is lying in bed, sitting at work her up and walking.  It is not worse with taking a deep breath.  She is also noticed aching in her bilateral arms but more so in the left upper extremity.  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  She will occasionally feel numbness and tingling of her fingers and her toes but is not currently feeling that now.  She is not currently having chest pain right now.  She is currently on her menses but not having significant abdominal pain or urinary symptoms.  She has no pain in her back.  She has not had cough, shortness of breath or fever.  She has been taking her son to the pool the last few days because it has been so hot and she is concerned she may be dehydrated because she has been out in the heat.  She does drink water but since having the gastric sleeve feels like she cannot drink as much as she used to.  She has no prior history of thyroid disease.  No history of vitamin deficiencies.  No history of blood clots.  No family history of clots.  The history is provided by the patient.  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   PPH (postpartum hemorrhage) 07/02/2011   Morbid obesity (HCC) 06/24/2011   History of gestational diabetes 06/24/2011   Red cell alloimmunization, maternal, 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    Past Surgical History:  Procedure Laterality Date   ADENOIDECTOMY     CESAREAN SECTION     CESAREAN SECTION  08/10/2011   Procedure: CESAREAN SECTION;  Surgeon: Tereso Newcomer, MD;  Location: [facility];  Service: Gynecology;  Laterality: N/A;   CYSTECTOMY     DILATION AND CURETTAGE OF UTERUS  2007   laparoscopic gastric sleeve surgery     LYSIS OF ADHESION N/A 05/31/2018   Procedure: LYSIS OF ADHESION;  Surgeon: Adolphus Birchwood, MD;  Location: [facility];  Service: Gynecology;  Laterality: N/A;   naval endometriois     ROBOTIC ASSISTED  TOTAL HYSTERECTOMY WITH BILATERAL SALPINGO OOPHERECTOMY Bilateral 05/31/2018   Procedure: XI ROBOTIC ASSISTED LEFT SALPINGO OOPHORECTOMY AND RIGHT URETERAL LYSIS;  Surgeon: Adolphus Birchwood, MD;  Location: [facility];  Service: Gynecology;  Laterality: Bilateral;   stent left groin after c section due to hemmorhage     TONSILLECTOMY     TOOTH EXTRACTION     uterine ateriography  12/21/2005   bilateral     OB History     Gravida  3   Para  2   Term  2   Preterm  0   AB  1   Living  2      SAB  0   IAB  1   Ectopic  0   Multiple  0   Live Births  2           Family History  Problem Relation Age of Onset   Diabetes Maternal Grandfather    Hypertension Father    Cancer Maternal Grandmother     Social History   Tobacco Use   Smoking status: Never   Smokeless tobacco: Never  Vaping Use   Vaping Use:  Never used  Substance Use Topics   Alcohol use: Not Currently    Comment: occasional   Drug use: Yes    Types: Marijuana    Comment: History of marijuana use in past, none in 3 months    Home Medications Prior to Admission medications   Medication Sig Start Date End Date Taking? Authorizing Provider  acetaminophen (TYLENOL) 500 MG tablet Take 1,000 mg by mouth every 6 (six) hours as needed (for pain.).     [provider]  calcium citrate-vitamin D (CELEBRATE CALCIUM CITRATE) 500-500 MG-UNIT chewable tablet Chew 1 tablet by mouth 2 (two) times daily.    [provider]  Etonogestrel (NEXPLANON ) Inject into the skin.    [provider]  Multiple Vitamins-Minerals (CELEBRATE MULTI-COMPLETE 60) CHEW Chew 1 tablet by mouth 2 (two) times daily.    [provider]  oxyCODONE (OXY IR/ROXICODONE) 5 MG immediate release tablet Take 1 tablet (5 mg total) by mouth every 6 (six) hours as needed for severe pain. 06/13/18   Cross, Efraim Kaufmann D, NP  pregabalin (LYRICA) 25 MG capsule Take 1 capsule (25 mg total) by mouth 2 (two) times daily. 05/15/20   Mesner, Barbara Cower, MD  vitamin B-12 (CYANOCOBALAMIN) 1000 MCG tablet Take 1,000 mcg by mouth daily. Celebrate Vitamin B-12 Quick-melt    [provider]    Allergies    Patient has no known allergies.  Review of Systems   Review of Systems  Cardiovascular:  Positive for chest pain.  All other systems reviewed and are negative.  Physical Exam Updated Vital Signs BP 137/85 (BP Location: Right Arm)   Pulse (!) 109   Temp 98.2 F (36.8 C) (Oral)   Resp 20   Ht 5\' 7"  (1.702 m)   Wt (!) 172.4 kg   LMP 04/11/2021   SpO2 100%   BMI 59.52 kg/m   Physical Exam Vitals and nursing note reviewed.  Constitutional:      General: She is not in acute distress.    Appearance: Normal appearance. She is well-developed.  HENT:     Head: Normocephalic and atraumatic.     Mouth/Throat:     Mouth: Mucous membranes are  dry.  Eyes:     Pupils: Pupils are equal, round, and reactive to light.  Cardiovascular:     Rate and Rhythm: Regular  rhythm. Tachycardia present.     Pulses: Normal pulses.     Heart sounds: Normal heart sounds. No murmur heard.   No friction rub.  Pulmonary:     Effort: Pulmonary effort is normal.     Breath sounds: Normal breath sounds. No wheezing or rales.  Chest:     Chest wall: No tenderness.  Abdominal:     General: Bowel sounds are normal. There is no distension.     Palpations: Abdomen is soft.     Tenderness: There is no abdominal tenderness. There is no guarding or rebound.  Musculoskeletal:        General: Tenderness present. Normal range of motion.     Cervical back: Normal range of motion and neck supple.     Right lower leg: No edema.     Left lower leg: No edema.     Comments: No edema.  Mild tenderness with palpation of the left tricep.  No notable upper extremity swelling.  Skin:    General: Skin is warm and dry.     Coloration: Skin is not pale.     Findings: No rash.  Neurological:     Mental Status: She is alert and oriented to person, place, and time. Mental status is at baseline.     Cranial Nerves: No cranial nerve deficit.  Psychiatric:        Mood and Affect: Mood normal.        Behavior: Behavior normal.    ED Results / Procedures / Treatments   Labs (all labs ordered are listed, but only abnormal results are displayed) Labs Reviewed  CBC WITH DIFFERENTIAL/PLATELET - Abnormal; Notable for the following components:      Result Value   HCT 34.4 (*)    MCV 72.4 (*)    MCH 25.3 (*)    RDW 16.4 (*)    All other components within normal limits  COMPREHENSIVE METABOLIC PANEL - Abnormal; Notable for the following components:   Glucose, Bld 117 (*)    All other components within normal limits  D-DIMER, QUANTITATIVE  TROPONIN I (HIGH SENSITIVITY)  TROPONIN I (HIGH SENSITIVITY)    EKG EKG Interpretation  Date/Time:  Sunday April 13 2021  08:15:01 EDT Ventricular Rate:  98 PR Interval:  121 QRS Duration: 81 QT Interval:  328 QTC Calculation: 419 R Axis:   43 Text Interpretation: Sinus rhythm Low voltage, precordial leads Abnormal R-wave progression, early transition No significant change since last tracing Confirmed by Gwyneth Sprout ([id number]) on 04/13/2021 8:29:49 AM  Radiology DG Chest Port 1 View  Result Date: 04/13/2021 CLINICAL DATA:  Chest pain. Additional provided: Patient reports midsternal chest pain radiating to left arm. EXAM: PORTABLE CHEST 1 VIEW COMPARISON:  Prior chest radiograph 05/15/2020 and earlier. FINDINGS: Heart size within normal limits. No appreciable airspace consolidation or pulmonary edema. No evidence of pleural effusion or pneumothorax. No acute bony abnormality identified. IMPRESSION: No evidence of active cardiopulmonary disease. Electronically Signed   By: Jackey Loge DO   On: 04/13/2021 09:30    Procedures Procedures   Medications Ordered in ED Medications  lactated ringers bolus 1,000 mL (has no administration in time range)    ED Course  I have reviewed the triage vital signs and the nursing notes.  Pertinent labs & imaging results that were available during my care of the patient were reviewed by me and considered in my medical decision making (see chart for details).    MDM Rules/Calculators/A&P  Patient is a pleasant 35 year old female presenting today with nonspecific chest pain and arm achiness.  Patient is tachycardic upon arrival here in the low 100s but satting 100% and denying any shortness of breath.  Patient is on Nexplanon and does have a fairly sedentary job.  Low risk [place] but will rule out with D-dimer.  EKG without acute changes from prior.  Pain is not classic for ACS and has now been ongoing for approximately 4 days intermittently.  Patient has had episodes this morning.  Will rule out with 1 troponin.  Patient denies any infectious symptoms  concerning for pneumonia, COVID.  Also patient is on her menses currently and does bleed heavily will ensure no new anemia as she does have a prior history.  Patient does have a prior gastric sleeve and concern for possible dehydration because she has been out in the heat.  She does complain of intermittent tingling in her hands and fingers possible vitamin deficiency which she will need to follow-up with her PCP.  We will also check a CMP to ensure no new onset diabetes.  10:56 AM Patient's labs are all within normal limits.  Troponins are normal, D-dimer is normal, chest x-ray is within normal limits, CMP is reassuring blood sugar is 117 and that is not fasting.  Patient is improved after Toradol.  Did recommend following up with her PCP she is taking vitamins at home but possible vitamin deficiency due to absorption issues.  She will follow-up with her PCP.  MDM   Amount and/or Complexity of Data Reviewed Clinical lab tests: ordered and reviewed Tests in the radiology section of CPT: ordered and reviewed Tests in the medicine section of CPT: ordered and reviewed Independent visualization of images, tracings, or specimens: yes    Final Clinical Impression(s) / ED Diagnoses Final diagnoses:  Nonspecific chest pain    Rx / DC Orders ED Discharge Orders     None        Gwyneth SproutPlunkett, Lot Med[place], MD 04/13/21 1058

## 2021-04-13 NOTE — ED Notes (Signed)
Portable X-ray t bedside

## 2021-06-19 ENCOUNTER — Other Ambulatory Visit: Payer: Self-pay

## 2021-06-19 ENCOUNTER — Encounter (HOSPITAL_BASED_OUTPATIENT_CLINIC_OR_DEPARTMENT_OTHER): Payer: Self-pay

## 2021-06-19 ENCOUNTER — Emergency Department (HOSPITAL_BASED_OUTPATIENT_CLINIC_OR_DEPARTMENT_OTHER): Payer: BC Managed Care – PPO

## 2021-06-19 ENCOUNTER — Emergency Department (HOSPITAL_BASED_OUTPATIENT_CLINIC_OR_DEPARTMENT_OTHER)
Admission: EM | Admit: 2021-06-19 | Discharge: 2021-06-19 | Disposition: A | Discharge status: Home / Self Care | Payer: BC Managed Care – PPO | Attending: Emergency Medicine | Admitting: Emergency Medicine

## 2021-06-19 DIAGNOSIS — M549 Dorsalgia, unspecified: Secondary | ICD-10-CM

## 2021-06-19 DIAGNOSIS — Y92410 Unspecified street and highway as the place of occurrence of the external cause: Secondary | ICD-10-CM | POA: Diagnosis not present

## 2021-06-19 DIAGNOSIS — R0789 Other chest pain: Secondary | ICD-10-CM | POA: Diagnosis not present

## 2021-06-19 DIAGNOSIS — R519 Headache, unspecified: Secondary | ICD-10-CM | POA: Diagnosis not present

## 2021-06-19 DIAGNOSIS — M791 Myalgia, unspecified site: Secondary | ICD-10-CM | POA: Insufficient documentation

## 2021-06-19 DIAGNOSIS — M545 Low back pain, unspecified: Secondary | ICD-10-CM | POA: Insufficient documentation

## 2021-06-19 DIAGNOSIS — M7918 Myalgia, other site: Secondary | ICD-10-CM

## 2021-06-19 MED ORDER — METHOCARBAMOL 500 MG PO TABS: 500.0000 mg | ORAL_TABLET | Freq: Two times a day (BID) | ORAL | 0 refills | Status: AC

## 2021-06-19 MED ORDER — NAPROXEN 500 MG PO TABS: 500.0000 mg | ORAL_TABLET | Freq: Two times a day (BID) | ORAL | 0 refills | Status: AC
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
## 2021-06-19 NOTE — Discharge Instructions (Addendum)
Your chest xray did not show any abnormalities Your pain is likely related to muscle soreness  Please pick up medications and take as prescribed. 

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
## 2021-06-19 NOTE — ED Notes (Signed)
Describes pain as hurting up and down her back and radiating out towards her hips.  Also c/o H/A  7/10

## 2021-06-21 ENCOUNTER — Emergency Department (HOSPITAL_BASED_OUTPATIENT_CLINIC_OR_DEPARTMENT_OTHER): Payer: BC Managed Care – PPO

## 2021-06-21 ENCOUNTER — Emergency Department (HOSPITAL_BASED_OUTPATIENT_CLINIC_OR_DEPARTMENT_OTHER)
Admission: EM | Admit: 2021-06-21 | Discharge: 2021-06-21 | Disposition: A | Discharge status: Home / Self Care | Payer: BC Managed Care – PPO | Attending: Emergency Medicine | Admitting: Emergency Medicine

## 2021-06-21 ENCOUNTER — Encounter (HOSPITAL_BASED_OUTPATIENT_CLINIC_OR_DEPARTMENT_OTHER): Payer: Self-pay | Admitting: Urology

## 2021-06-21 ENCOUNTER — Other Ambulatory Visit: Payer: Self-pay

## 2021-06-21 DIAGNOSIS — M5442 Lumbago with sciatica, left side: Secondary | ICD-10-CM | POA: Diagnosis not present

## 2021-06-21 DIAGNOSIS — Y92410 Unspecified street and highway as the place of occurrence of the external cause: Secondary | ICD-10-CM | POA: Insufficient documentation

## 2021-06-21 DIAGNOSIS — R202 Paresthesia of skin: Secondary | ICD-10-CM | POA: Diagnosis not present

## 2021-06-21 DIAGNOSIS — M5432 Sciatica, left side: Secondary | ICD-10-CM

## 2021-06-21 DIAGNOSIS — M545 Low back pain, unspecified: Secondary | ICD-10-CM | POA: Diagnosis present

## 2021-06-21 MED ORDER — LIDOCAINE 5 % EX PTCH: 3.0000 | MEDICATED_PATCH | CUTANEOUS | 0 refills | Status: AC

## 2021-06-21 MED ORDER — ACETAMINOPHEN 500 MG PO TABS: 1000.0000 mg | ORAL_TABLET | Freq: Once | ORAL | Status: DC

## 2021-06-21 NOTE — Discharge Instructions (Addendum)
You have been sent lidocaine patches to your pharmacy.  You may use up to 3 patches at the same time however be sure to have 12 hours between use.  You can continue to treat your pain with Tylenol, avoid NSAIDs as instructed after your surgery.  You may also utilize the Robaxin that was given to you during your previous visit to the ER.  Utilize a sports medicine referral if you do not feel your symptoms are improving over the next 2 weeks.
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
## 2021-06-23 DIAGNOSIS — M722 Plantar fascial fibromatosis: Secondary | ICD-10-CM | POA: Insufficient documentation

## 2021-06-23 DIAGNOSIS — M6701 Short Achilles tendon (acquired), right ankle: Secondary | ICD-10-CM | POA: Insufficient documentation

## 2021-07-16 DIAGNOSIS — Z903 Acquired absence of stomach [part of]: Secondary | ICD-10-CM | POA: Insufficient documentation

## 2022-06-29 DIAGNOSIS — N760 Acute vaginitis: Secondary | ICD-10-CM | POA: Insufficient documentation

## 2022-06-29 DIAGNOSIS — B3731 Acute candidiasis of vulva and vagina: Secondary | ICD-10-CM | POA: Insufficient documentation

## 2022-06-29 DIAGNOSIS — B9689 Other specified bacterial agents as the cause of diseases classified elsewhere: Secondary | ICD-10-CM | POA: Insufficient documentation

## 2022-06-29 DIAGNOSIS — N739 Female pelvic inflammatory disease, unspecified: Secondary | ICD-10-CM | POA: Insufficient documentation

## 2022-06-29 DIAGNOSIS — F419 Anxiety disorder, unspecified: Secondary | ICD-10-CM | POA: Insufficient documentation

## 2022-12-06 ENCOUNTER — Emergency Department (HOSPITAL_BASED_OUTPATIENT_CLINIC_OR_DEPARTMENT_OTHER)
Admission: EM | Admit: 2022-12-06 | Discharge: 2022-12-06 | Disposition: A | Discharge status: Other Acute Inpatient Hospital | Payer: BC Managed Care – PPO | Attending: Emergency Medicine | Admitting: Emergency Medicine

## 2022-12-06 ENCOUNTER — Emergency Department (HOSPITAL_BASED_OUTPATIENT_CLINIC_OR_DEPARTMENT_OTHER): Payer: BC Managed Care – PPO

## 2022-12-06 ENCOUNTER — Encounter (HOSPITAL_BASED_OUTPATIENT_CLINIC_OR_DEPARTMENT_OTHER): Payer: Self-pay | Admitting: Emergency Medicine

## 2022-12-06 ENCOUNTER — Other Ambulatory Visit: Payer: Self-pay

## 2022-12-06 DIAGNOSIS — K439 Ventral hernia without obstruction or gangrene: Secondary | ICD-10-CM

## 2022-12-06 DIAGNOSIS — K56609 Unspecified intestinal obstruction, unspecified as to partial versus complete obstruction: Secondary | ICD-10-CM

## 2022-12-06 DIAGNOSIS — K420 Umbilical hernia with obstruction, without gangrene: Secondary | ICD-10-CM | POA: Insufficient documentation

## 2022-12-06 DIAGNOSIS — K436 Other and unspecified ventral hernia with obstruction, without gangrene: Secondary | ICD-10-CM | POA: Insufficient documentation

## 2022-12-06 DIAGNOSIS — R109 Unspecified abdominal pain: Secondary | ICD-10-CM | POA: Diagnosis present

## 2022-12-06 LAB — LACTIC ACID, PLASMA: Lactic Acid, Venous: 1.4 mmol/L (ref 0.5–1.9)

## 2022-12-06 LAB — BASIC METABOLIC PANEL
Anion gap: 7 (ref 5–15)
BUN: 12 mg/dL (ref 6–20)
CO2: 25 mmol/L (ref 22–32)
Calcium: 8.9 mg/dL (ref 8.9–10.3)
Chloride: 104 mmol/L (ref 98–111)
Creatinine, Ser: 0.65 mg/dL (ref 0.44–1.00)
GFR, Estimated: >60 mL/min (ref >60)
Glucose, Bld: 109 mg/dL — ABNORMAL HIGH (ref 70–99)
Potassium: 3.6 mmol/L (ref 3.5–5.1)
Sodium: 136 mmol/L (ref 135–145)

## 2022-12-06 LAB — CBC WITH DIFFERENTIAL/PLATELET
Abs Immature Granulocytes: 0.01 10*3/uL (ref 0.00–0.07)
Basophils Absolute: 0 10*3/uL (ref 0.0–0.1)
Basophils Relative: 0 %
Eosinophils Absolute: 0 10*3/uL (ref 0.0–0.5)
Eosinophils Relative: 0 %
HCT: 36.1 % (ref 36.0–46.0)
Hemoglobin: 12.9 g/dL (ref 12.0–15.0)
Immature Granulocytes: 0 %
Lymphocytes Relative: 16 %
Lymphs Abs: 0.8 10*3/uL (ref 0.7–4.0)
MCH: 26.5 pg (ref 26.0–34.0)
MCHC: 35.7 g/dL (ref 30.0–36.0)
MCV: 74.3 fL — ABNORMAL LOW (ref 80.0–100.0)
Monocytes Absolute: 0.2 10*3/uL (ref 0.1–1.0)
Monocytes Relative: 4 %
Neutro Abs: 4 10*3/uL (ref 1.7–7.7)
Neutrophils Relative %: 80 %
Platelets: 305 10*3/uL (ref 150–400)
RBC: 4.86 MIL/uL (ref 3.87–5.11)
RDW: 15.3 % (ref 11.5–15.5)
WBC: 5 10*3/uL (ref 4.0–10.5)
nRBC: 0 % (ref 0.0–0.2)

## 2022-12-06 LAB — PREGNANCY, URINE: Preg Test, Ur: NEGATIVE

## 2022-12-06 MED ORDER — HYDROMORPHONE HCL 1 MG/ML IJ SOLN
1.0000 mg | Freq: Once | INTRAMUSCULAR | Status: AC
  Administered 2022-12-06: 1 mg via INTRAVENOUS
  Filled 2022-12-06: qty 1

## 2022-12-06 MED ORDER — FENTANYL CITRATE PF 50 MCG/ML IJ SOSY
100.0000 ug | PREFILLED_SYRINGE | Freq: Once | INTRAMUSCULAR | Status: AC
  Administered 2022-12-06: 100 ug via INTRAVENOUS
  Filled 2022-12-06: qty 2

## 2022-12-06 MED ORDER — ONDANSETRON HCL 4 MG/2ML IJ SOLN
4.0000 mg | Freq: Once | INTRAMUSCULAR | Status: AC
  Administered 2022-12-06: 4 mg via INTRAVENOUS
  Filled 2022-12-06: qty 2

## 2022-12-06 MED ORDER — SODIUM CHLORIDE 0.9 % IV BOLUS
1000.0000 mL | Freq: Once | INTRAVENOUS | Status: AC
  Administered 2022-12-06: 1000 mL via INTRAVENOUS

## 2022-12-06 MED ORDER — IOHEXOL 300 MG/ML  SOLN
100.0000 mL | Freq: Once | INTRAMUSCULAR | Status: AC | PRN
  Administered 2022-12-06: 100 mL via INTRAVENOUS

## 2022-12-06 NOTE — ED Notes (Signed)
Report called to [facility] ED, [phone].

## 2022-12-06 NOTE — ED Notes (Signed)
Report given to Jesse Fall at [facility] ED.

## 2022-12-06 NOTE — ED Notes (Signed)
Husband took all of pt's belongings. No items were taken with patient during transport.

## 2022-12-06 NOTE — ED Notes (Signed)
Report given to [employer], ETA 40mn.

## 2022-12-06 NOTE — ED Notes (Signed)
Called [facility] spoke with Dub Mikes for general surgery consult at 11:58.

## 2022-12-06 NOTE — ED Triage Notes (Signed)
Pt arrives pov, c/o abdominal pain with n/v starting last night. Pt reports "I think its my hernia". Also endorses "feeling twisting".

## 2022-12-06 NOTE — ED Provider Notes (Signed)
[place] [facility] Provider Note   CSN: [id number] Arrival date & time: 12/06/22  [id number]     History  Chief Complaint  Patient presents with   Abdominal Pain    Kathleen Watkins is a 37 y.o. female with a history of gastric sleeve, nephrectomy, presenting to the emergency department with concern for abdominal pain, nausea vomiting.  Symptoms began yesterday evening.  She feels that her ventral hernia is jotting more than normal and also tender.  She reports she had green looking vomit overnight.  She has not passed gas or had bowel movement since yesterday evening.  He denies history of bowel obstruction.  Her pain is significant.  Patient reports a history of nephrectomy.  She reports no concerns for pregnancy at this time  HPI     Home Medications Prior to Admission medications   Medication Sig Start Date End Date Taking? Authorizing Provider  acetaminophen (TYLENOL) 500 MG tablet Take 1,000 mg by mouth every 6 (six) hours as needed (for pain.).     [provider]  calcium citrate-vitamin D (CELEBRATE CALCIUM CITRATE) 500-500 MG-UNIT chewable tablet Chew 1 tablet by mouth 2 (two) times daily.    [provider]  Etonogestrel (NEXPLANON [place]) Inject into the skin.    [provider]  lidocaine (LIDODERM) 5 % Place 3 patches onto the skin daily. Remove & Discard patch within 12 hours or as directed by MD 06/21/21   Redwine, Madison A, PA-C  methocarbamol (ROBAXIN) 500 MG tablet Take 1 tablet (500 mg total) by mouth 2 (two) times daily. 06/19/21   Eustaquio Maize, PA-C  Multiple Vitamins-Minerals (CELEBRATE MULTI-COMPLETE 60) CHEW Chew 1 tablet by mouth 2 (two) times daily.    [provider]  naproxen (NAPROSYN) 500 MG tablet Take 1 tablet (500 mg total) by mouth 2 (two) times daily. 06/19/21   Alroy Bailiff, Margaux, PA-C  oxyCODONE (OXY IR/ROXICODONE) 5 MG immediate release tablet Take 1 tablet (5 mg total) by mouth  every 6 (six) hours as needed for severe pain. 06/13/18   Cross, Lenna Sciara D, NP  pregabalin (LYRICA) 25 MG capsule Take 1 capsule (25 mg total) by mouth 2 (two) times daily. 05/15/20   Mesner, Corene Cornea, MD  vitamin B-12 (CYANOCOBALAMIN) 1000 MCG tablet Take 1,000 mcg by mouth daily. Celebrate Vitamin B-12 Quick-melt    [provider]      Allergies    Patient has no known allergies.    Review of Systems   Review of Systems  Physical Exam Updated Vital Signs BP 110/69 (BP Location: Left Arm)   Pulse 87   Temp 98.2 F (36.8 C) (Oral)   Resp 17   Ht 5' 7"$  (1.702 m)   Wt 110.2 kg   LMP 11/26/2022   SpO2 98%   BMI 38.06 kg/m  Physical Exam Constitutional:      General: She is not in acute distress.    Appearance: She is obese.  HENT:     Head: Normocephalic and atraumatic.  Eyes:     Conjunctiva/sclera: Conjunctivae normal.     Pupils: Pupils are equal, round, and reactive to light.  Cardiovascular:     Rate and Rhythm: Normal rate and regular rhythm.  Pulmonary:     Effort: Pulmonary effort is normal. No respiratory distress.  Abdominal:     General: There is no distension.     Comments: Patient is a tender moderately sized ventral and periumbilical hernia that is not reducible  on exam, without overlying skin changes.  Skin:    General: Skin is warm and dry.  Neurological:     General: No focal deficit present.     Mental Status: She is alert. Mental status is at baseline.  Psychiatric:        Mood and Affect: Mood normal.        Behavior: Behavior normal.     ED Results / Procedures / Treatments   Labs (all labs ordered are listed, but only abnormal results are displayed) Labs Reviewed  BASIC METABOLIC PANEL - Abnormal; Notable for the following components:      Result Value   Glucose, Bld 109 (*)    All other components within normal limits  CBC WITH DIFFERENTIAL/PLATELET - Abnormal; Notable for the following components:   MCV 74.3 (*)    All other  components within normal limits  LACTIC ACID, PLASMA  PREGNANCY, URINE    EKG None  Radiology CT ABDOMEN PELVIS W CONTRAST  Addendum Date: 12/06/2022   ADDENDUM REPORT: 12/06/2022 12:02 ADDENDUM: Voice recognition error in the last sentence of the first impression paragraph. This sentence should read as follows: "No pneumatosis or overt CT signs of small bowel ischemia at this time." Electronically Signed   By: Misty Stanley M.D.   On: 12/06/2022 12:02   Result Date: 12/06/2022 CLINICAL DATA:  History of umbilical hernia. Bowel obstruction suspected. EXAM: CT ABDOMEN AND PELVIS WITH CONTRAST TECHNIQUE: Multidetector CT imaging of the abdomen and pelvis was performed using the standard protocol following bolus administration of intravenous contrast. RADIATION DOSE REDUCTION: This exam was performed according to the departmental dose-optimization program which includes automated exposure control, adjustment of the mA and/or kV according to patient size and/or use of iterative reconstruction technique. CONTRAST:  123m OMNIPAQUE IOHEXOL 300 MG/ML  SOLN COMPARISON:  05/21/2010 FINDINGS: Lower chest: Unremarkable. Hepatobiliary: No suspicious focal abnormality in the liver on this study without intravenous contrast. There is no evidence for gallstones, gallbladder wall thickening, or pericholecystic fluid. No intrahepatic or extrahepatic biliary dilation. Pancreas: No focal mass lesion. No dilatation of the main duct. No intraparenchymal cyst. No peripancreatic edema. Spleen: No splenomegaly. No focal mass lesion. Adrenals/Urinary Tract: No adrenal nodule or mass. 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 Tiny stones are identified in the interpolar left kidney without hydronephrosis. No evidence for hydroureter. The urinary bladder appears normal for the degree of distention. Stomach/Bowel: Surgical changes noted in the stomach. Duodenum is normally  positioned as is the ligament of Treitz. Small bowel is fluid-filled and distended up to about 3.2 cm. Small bowel loop just proximal to a paraumbilical hernia is fluid-filled and distended before exiting into the hernia sac. Small bowel exiting the hernia sac is decompressed. There is some fluid and edema in the hernia sac around the short segment small bowel. Distal small remains decompressed into the terminal ileum. The appendix is not well visualized, but there is no edema or inflammation in the region of the cecum. No gross colonic mass. No colonic wall thickening. Vascular/Lymphatic: No abdominal aortic aneurysm. No abdominal aortic atherosclerotic calcification. There is no gastrohepatic or hepatoduodenal ligament lymphadenopathy. No retroperitoneal or mesenteric lymphadenopathy. No pelvic sidewall lymphadenopathy. Reproductive: The uterus is unremarkable.  There is no adnexal mass. Other: No intraperitoneal free fluid. Musculoskeletal: Tiny sclerotic focus in the left pubic bone is likely a bone island. No worrisome lytic or sclerotic osseous abnormality. IMPRESSION: 1. Incarcerated paraumbilical hernia contains a short segment  of small bowel with associated edema and fluid in the hernia sac. Small bowel proximal to the herniated segment is dilated up to 3.2 cm and diffusely fluid-filled, consistent with obstruction. Pneumatosis or overt CT signs of small bowel ischemia at this time. 2. 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 Tiny stones are identified in the interpolar left kidney without hydronephrosis. Electronically Signed: By: Misty Stanley M.D. On: 12/06/2022 11:45    Procedures Procedures    Medications Ordered in ED Medications  sodium chloride 0.9 % bolus 1,000 mL ( Intravenous Stopped 12/06/22 1051)  fentaNYL (SUBLIMAZE) injection 100 mcg (100 mcg Intravenous Given 12/06/22 1007)  ondansetron (ZOFRAN) injection 4 mg (4 mg Intravenous  Given 12/06/22 1009)  iohexol (OMNIPAQUE) 300 MG/ML solution 100 mL (100 mLs Intravenous Contrast Given 12/06/22 1055)  HYDROmorphone (DILAUDID) injection 1 mg (1 mg Intravenous Given 12/06/22 1117)  HYDROmorphone (DILAUDID) injection 1 mg (1 mg Intravenous Given 12/06/22 1303)    ED Course/ Medical Decision Making/ A&P Clinical Course as of 12/06/22 1316  Sun Dec 06, 2022  1154 Gastric sleeve in 2017 with Dr Toney Rakes at [facility] in 2022 by Dr Justus Memory at [facility] for hernia assessment [MT]  [id number] Patient reassessed and had improvement of her pain overall.  She has some mild persistent nausea.  I am not able to reduce the hernia at the bedside.  It does not feel incarcerated to me.  Lactate is reassuring and normal as well as no leukocytosis.  But given that this is likely the point of obstruction, I did speak to Dr Johney Maine on-call for general surgery in our system, and he recommended that I reach out to [facility] initially to discuss potential transfer given that the patient had her bariatric surgery performed with her services in 2017, was also seen 2 years ago in general consultation in their office for this hernia.  Secretary is paging the [facility] transfer center now [MT]  [place] [MT]  1232 Pt accepted to [facility] by Dr Laurance Flatten from their surgical service.  Patient is agreeable for transfer.  We are calling the transfer center to arrange for transportation.  She remains asymptomatic at the moment, which is reassuring [MT]    Clinical Course User Index [MT] Yedidya Duddy, Carola Rhine, MD                             Medical Decision Making Amount and/or Complexity of Data Reviewed Labs: ordered. Radiology: ordered. Decision-making details documented in ED Course.  Risk Prescription drug management.   This patient presents to the ED with concern for abdominal pain. This involves an extensive number of treatment options, and is a complaint that  carries with it a high risk of complications and morbidity.  The differential diagnosis includes incarcerated hernia versus bowel obstruction versus ileus versus gastroparesis versus acute biliary disease versus other  Co-morbidities that complicate the patient evaluation: History of abdominal surgery at high risk of bowel obstruction.  History of hernia at high risk of complication for hernia.  I ordered and personally interpreted labs.  The pertinent results include: Lactate and white blood cell count within normal limits.  I ordered imaging studies including CT abdomen pelvis I independently visualized and interpreted imaging which showed bowel obstruction secondary to ventral hernia. I agree with the radiologist interpretation   I ordered medication including IV  fentanyl, IV Zofran, IV fluids for abdominal pain, nausea and hydration  I have reviewed the patients home medicines and have made adjustments as needed   After the interventions noted above, I reevaluated the patient and found that they have: improved   Dispostion:  After consideration of the diagnostic results and the patients response to treatment, I feel that the patent would benefit from emergent transfer to [facility] for evaluation by surgical specialist.  Please see ED course.  Patient was assessed immediately prior to transfer and remained stable for transportation.         Final Clinical Impression(s) / ED Diagnoses Final diagnoses:  Intestinal obstruction, unspecified cause, unspecified whether partial or complete ([place])  Hernia of abdominal wall    Rx / DC Orders ED Discharge Orders     None         Wyvonnia Dusky, MD 12/06/22 1316
